# Patient Record
Sex: Female | Born: 1940 | Hispanic: No | Smoking: Former smoker
Health system: Southern US, Community
[De-identification: ages and names within clinical notes are randomized; demographics above are authoritative.]

## PROBLEM LIST (undated history)

## (undated) DIAGNOSIS — F419 Anxiety disorder, unspecified: Secondary | ICD-10-CM

## (undated) DIAGNOSIS — M109 Gout, unspecified: Secondary | ICD-10-CM

## (undated) DIAGNOSIS — M810 Age-related osteoporosis without current pathological fracture: Secondary | ICD-10-CM

## (undated) DIAGNOSIS — M1990 Unspecified osteoarthritis, unspecified site: Secondary | ICD-10-CM

## (undated) DIAGNOSIS — R011 Cardiac murmur, unspecified: Secondary | ICD-10-CM

## (undated) DIAGNOSIS — E079 Disorder of thyroid, unspecified: Secondary | ICD-10-CM

## (undated) DIAGNOSIS — K7689 Other specified diseases of liver: Secondary | ICD-10-CM

## (undated) DIAGNOSIS — E559 Vitamin D deficiency, unspecified: Secondary | ICD-10-CM

## (undated) DIAGNOSIS — R51 Headache: Secondary | ICD-10-CM

## (undated) DIAGNOSIS — N281 Cyst of kidney, acquired: Secondary | ICD-10-CM

## (undated) DIAGNOSIS — I10 Essential (primary) hypertension: Secondary | ICD-10-CM

## (undated) DIAGNOSIS — C801 Malignant (primary) neoplasm, unspecified: Secondary | ICD-10-CM

## (undated) DIAGNOSIS — F32A Depression, unspecified: Secondary | ICD-10-CM

## (undated) DIAGNOSIS — E785 Hyperlipidemia, unspecified: Secondary | ICD-10-CM

## (undated) DIAGNOSIS — N200 Calculus of kidney: Secondary | ICD-10-CM

## (undated) DIAGNOSIS — G4700 Insomnia, unspecified: Secondary | ICD-10-CM

## (undated) DIAGNOSIS — E039 Hypothyroidism, unspecified: Secondary | ICD-10-CM

## (undated) DIAGNOSIS — J45909 Unspecified asthma, uncomplicated: Secondary | ICD-10-CM

## (undated) DIAGNOSIS — J310 Chronic rhinitis: Secondary | ICD-10-CM

## (undated) DIAGNOSIS — M549 Dorsalgia, unspecified: Secondary | ICD-10-CM

## (undated) DIAGNOSIS — C4491 Basal cell carcinoma of skin, unspecified: Secondary | ICD-10-CM

## (undated) DIAGNOSIS — G8929 Other chronic pain: Secondary | ICD-10-CM

## (undated) DIAGNOSIS — F329 Major depressive disorder, single episode, unspecified: Secondary | ICD-10-CM

## (undated) HISTORY — PX: KNEE SURGERY: SHX244

## (undated) HISTORY — PX: STOMACH SURGERY: SHX791

## (undated) HISTORY — PX: CATARACT EXTRACTION: SUR2

## (undated) HISTORY — PX: TONSILLECTOMY: SUR1361

## (undated) HISTORY — PX: GLAUCOMA SURGERY: SHX656

## (undated) HISTORY — PX: ABDOMINAL HYSTERECTOMY: SHX81

## (undated) HISTORY — PX: TONSILLECTOMY: SHX5217

## (undated) SURGERY — Surgical Case
Anesthesia: *Unknown

---

## 2005-08-20 ENCOUNTER — Emergency Department (HOSPITAL_COMMUNITY): Admission: EM | Admit: 2005-08-20 | Discharge: 2005-08-20 | Payer: Self-pay | Admitting: Emergency Medicine

## 2005-12-04 ENCOUNTER — Emergency Department (HOSPITAL_COMMUNITY): Admission: EM | Admit: 2005-12-04 | Discharge: 2005-12-04 | Payer: Self-pay | Admitting: Emergency Medicine

## 2007-02-10 ENCOUNTER — Emergency Department (HOSPITAL_COMMUNITY): Admission: EM | Admit: 2007-02-10 | Discharge: 2007-02-10 | Payer: Self-pay | Admitting: Emergency Medicine

## 2008-02-03 ENCOUNTER — Other Ambulatory Visit: Admission: RE | Admit: 2008-02-03 | Discharge: 2008-02-03 | Payer: Self-pay | Admitting: Internal Medicine

## 2008-08-22 ENCOUNTER — Emergency Department (HOSPITAL_COMMUNITY): Admission: EM | Admit: 2008-08-22 | Discharge: 2008-08-22 | Payer: Self-pay | Admitting: Family Medicine

## 2008-12-19 ENCOUNTER — Encounter: Admission: RE | Admit: 2008-12-19 | Discharge: 2008-12-19 | Payer: Self-pay | Admitting: Internal Medicine

## 2009-04-03 ENCOUNTER — Encounter: Admission: RE | Admit: 2009-04-03 | Discharge: 2009-04-03 | Payer: Self-pay | Admitting: Internal Medicine

## 2009-04-18 ENCOUNTER — Ambulatory Visit (HOSPITAL_COMMUNITY): Admission: RE | Admit: 2009-04-18 | Discharge: 2009-04-18 | Payer: Self-pay | Admitting: Urology

## 2009-08-02 ENCOUNTER — Ambulatory Visit (HOSPITAL_COMMUNITY): Admission: RE | Admit: 2009-08-02 | Discharge: 2009-08-02 | Payer: Self-pay | Admitting: Internal Medicine

## 2009-12-20 ENCOUNTER — Encounter: Admission: RE | Admit: 2009-12-20 | Discharge: 2009-12-20 | Payer: Self-pay | Admitting: Internal Medicine

## 2011-01-20 ENCOUNTER — Other Ambulatory Visit: Payer: Self-pay | Admitting: Internal Medicine

## 2011-01-20 DIAGNOSIS — Z1231 Encounter for screening mammogram for malignant neoplasm of breast: Secondary | ICD-10-CM

## 2011-01-31 ENCOUNTER — Ambulatory Visit
Admission: RE | Admit: 2011-01-31 | Discharge: 2011-01-31 | Disposition: A | Discharge status: Home / Self Care | Payer: Medicare Other | Source: Ambulatory Visit | Attending: Internal Medicine | Admitting: Internal Medicine

## 2011-01-31 DIAGNOSIS — Z1231 Encounter for screening mammogram for malignant neoplasm of breast: Secondary | ICD-10-CM

## 2011-05-12 DIAGNOSIS — F411 Generalized anxiety disorder: Secondary | ICD-10-CM | POA: Diagnosis not present

## 2011-05-12 DIAGNOSIS — N1330 Unspecified hydronephrosis: Secondary | ICD-10-CM | POA: Diagnosis not present

## 2011-05-12 DIAGNOSIS — K219 Gastro-esophageal reflux disease without esophagitis: Secondary | ICD-10-CM | POA: Diagnosis not present

## 2011-05-15 DIAGNOSIS — N281 Cyst of kidney, acquired: Secondary | ICD-10-CM | POA: Diagnosis not present

## 2011-05-24 DIAGNOSIS — N281 Cyst of kidney, acquired: Secondary | ICD-10-CM | POA: Diagnosis not present

## 2011-06-04 DIAGNOSIS — N281 Cyst of kidney, acquired: Secondary | ICD-10-CM | POA: Diagnosis not present

## 2011-06-06 DIAGNOSIS — D510 Vitamin B12 deficiency anemia due to intrinsic factor deficiency: Secondary | ICD-10-CM | POA: Diagnosis not present

## 2011-06-06 DIAGNOSIS — K219 Gastro-esophageal reflux disease without esophagitis: Secondary | ICD-10-CM | POA: Diagnosis not present

## 2011-06-06 DIAGNOSIS — R109 Unspecified abdominal pain: Secondary | ICD-10-CM | POA: Diagnosis not present

## 2011-07-11 DIAGNOSIS — R12 Heartburn: Secondary | ICD-10-CM | POA: Diagnosis not present

## 2011-07-11 DIAGNOSIS — D126 Benign neoplasm of colon, unspecified: Secondary | ICD-10-CM | POA: Diagnosis not present

## 2011-07-11 DIAGNOSIS — D128 Benign neoplasm of rectum: Secondary | ICD-10-CM | POA: Diagnosis not present

## 2011-07-11 DIAGNOSIS — R1031 Right lower quadrant pain: Secondary | ICD-10-CM | POA: Diagnosis not present

## 2011-07-11 DIAGNOSIS — K5730 Diverticulosis of large intestine without perforation or abscess without bleeding: Secondary | ICD-10-CM | POA: Diagnosis not present

## 2011-07-11 DIAGNOSIS — D131 Benign neoplasm of stomach: Secondary | ICD-10-CM | POA: Diagnosis not present

## 2011-07-11 DIAGNOSIS — D449 Neoplasm of uncertain behavior of unspecified endocrine gland: Secondary | ICD-10-CM | POA: Diagnosis not present

## 2011-07-11 DIAGNOSIS — R1032 Left lower quadrant pain: Secondary | ICD-10-CM | POA: Diagnosis not present

## 2011-07-11 DIAGNOSIS — D129 Benign neoplasm of anus and anal canal: Secondary | ICD-10-CM | POA: Diagnosis not present

## 2011-07-11 DIAGNOSIS — K228 Other specified diseases of esophagus: Secondary | ICD-10-CM | POA: Diagnosis not present

## 2011-07-25 DIAGNOSIS — D510 Vitamin B12 deficiency anemia due to intrinsic factor deficiency: Secondary | ICD-10-CM | POA: Diagnosis not present

## 2011-07-25 DIAGNOSIS — D3A092 Benign carcinoid tumor of the stomach: Secondary | ICD-10-CM | POA: Diagnosis not present

## 2011-07-25 DIAGNOSIS — K862 Cyst of pancreas: Secondary | ICD-10-CM | POA: Diagnosis not present

## 2011-07-25 DIAGNOSIS — K863 Pseudocyst of pancreas: Secondary | ICD-10-CM | POA: Diagnosis not present

## 2011-07-28 DIAGNOSIS — D3A092 Benign carcinoid tumor of the stomach: Secondary | ICD-10-CM | POA: Diagnosis not present

## 2011-07-28 DIAGNOSIS — I10 Essential (primary) hypertension: Secondary | ICD-10-CM | POA: Diagnosis not present

## 2011-07-28 DIAGNOSIS — E782 Mixed hyperlipidemia: Secondary | ICD-10-CM | POA: Diagnosis not present

## 2011-07-28 DIAGNOSIS — J45909 Unspecified asthma, uncomplicated: Secondary | ICD-10-CM | POA: Diagnosis not present

## 2011-07-28 DIAGNOSIS — E039 Hypothyroidism, unspecified: Secondary | ICD-10-CM | POA: Diagnosis not present

## 2011-07-28 DIAGNOSIS — Z23 Encounter for immunization: Secondary | ICD-10-CM | POA: Diagnosis not present

## 2011-07-30 ENCOUNTER — Telehealth: Payer: Self-pay | Admitting: Hematology and Oncology

## 2011-07-30 NOTE — Telephone Encounter (Signed)
called pt lmovm to rtn call to schedule appt for 04/04

## 2011-07-31 ENCOUNTER — Telehealth: Payer: Self-pay | Admitting: Hematology and Oncology

## 2011-07-31 NOTE — Telephone Encounter (Signed)
called pt and she confirmed appt for 04/04.  faxed over a letter to Dr. Daphine Deutscher with appt d/t

## 2011-08-04 ENCOUNTER — Telehealth: Payer: Self-pay | Admitting: Hematology and Oncology

## 2011-08-04 NOTE — Telephone Encounter (Signed)
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

## 2011-08-07 ENCOUNTER — Ambulatory Visit (HOSPITAL_BASED_OUTPATIENT_CLINIC_OR_DEPARTMENT_OTHER): Payer: Medicare Other | Admitting: Hematology and Oncology

## 2011-08-07 ENCOUNTER — Ambulatory Visit: Payer: Medicare Other

## 2011-08-07 ENCOUNTER — Encounter: Payer: Self-pay | Admitting: Hematology and Oncology

## 2011-08-07 ENCOUNTER — Ambulatory Visit (HOSPITAL_BASED_OUTPATIENT_CLINIC_OR_DEPARTMENT_OTHER): Payer: Medicare Other | Admitting: Lab

## 2011-08-07 ENCOUNTER — Telehealth: Payer: Self-pay | Admitting: Hematology and Oncology

## 2011-08-07 VITALS — BP 146/89 | HR 84 | Temp 97.4°F | Ht 63.0 in | Wt 177.6 lb

## 2011-08-07 DIAGNOSIS — C7A092 Malignant carcinoid tumor of the stomach: Secondary | ICD-10-CM

## 2011-08-07 DIAGNOSIS — D510 Vitamin B12 deficiency anemia due to intrinsic factor deficiency: Secondary | ICD-10-CM | POA: Insufficient documentation

## 2011-08-07 DIAGNOSIS — D3A092 Benign carcinoid tumor of the stomach: Secondary | ICD-10-CM | POA: Insufficient documentation

## 2011-08-07 DIAGNOSIS — M109 Gout, unspecified: Secondary | ICD-10-CM | POA: Insufficient documentation

## 2011-08-07 DIAGNOSIS — M1990 Unspecified osteoarthritis, unspecified site: Secondary | ICD-10-CM | POA: Insufficient documentation

## 2011-08-07 DIAGNOSIS — I10 Essential (primary) hypertension: Secondary | ICD-10-CM | POA: Insufficient documentation

## 2011-08-07 DIAGNOSIS — J45909 Unspecified asthma, uncomplicated: Secondary | ICD-10-CM | POA: Insufficient documentation

## 2011-08-07 DIAGNOSIS — H409 Unspecified glaucoma: Secondary | ICD-10-CM | POA: Insufficient documentation

## 2011-08-07 DIAGNOSIS — Z9889 Other specified postprocedural states: Secondary | ICD-10-CM | POA: Insufficient documentation

## 2011-08-07 DIAGNOSIS — E039 Hypothyroidism, unspecified: Secondary | ICD-10-CM | POA: Insufficient documentation

## 2011-08-07 DIAGNOSIS — E785 Hyperlipidemia, unspecified: Secondary | ICD-10-CM | POA: Insufficient documentation

## 2011-08-07 DIAGNOSIS — C7A00 Malignant carcinoid tumor of unspecified site: Secondary | ICD-10-CM

## 2011-08-07 DIAGNOSIS — Z90710 Acquired absence of both cervix and uterus: Secondary | ICD-10-CM | POA: Insufficient documentation

## 2011-08-07 DIAGNOSIS — N83209 Unspecified ovarian cyst, unspecified side: Secondary | ICD-10-CM | POA: Diagnosis not present

## 2011-08-07 LAB — CBC WITH DIFFERENTIAL/PLATELET
BASO%: 0.9 % (ref 0.0–2.0)
Eosinophils Absolute: 0.2 10*3/uL (ref 0.0–0.5)
MCH: 26.8 pg (ref 25.1–34.0)
MONO%: 7 % (ref 0.0–14.0)
Platelets: 210 10*3/uL (ref 145–400)
RBC: 4.62 10*6/uL (ref 3.70–5.45)
RDW: 16 % — ABNORMAL HIGH (ref 11.2–14.5)
WBC: 6.3 10*3/uL (ref 3.9–10.3)
nRBC: 0 % (ref 0–0)

## 2011-08-07 NOTE — Progress Notes (Signed)
Dr.    Burney Gauze     -     Primary. Dr.    Josefa Half   -      GI. Dr.    Marcha Solders      -    Urologist  In  [facility].  [employer]   [facility]  On   [address] /  [address]  Cell    Phone       [phone].

## 2011-08-07 NOTE — Progress Notes (Signed)
This office note has been dictated.

## 2011-08-07 NOTE — Progress Notes (Signed)
CC:   Kellie Housekeeper, MD Kellie Ward, [name] Kellie Dubin, MD Kellie Mouse, MD, Fax [phone] Kellie Ward. Kellie Ward, [name] Kellie Hitch, MD  REFERRING PHYSICIAN:  [name], [name]  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  HISTORY OF PRESENT ILLNESS:  The patient states that in general her history dates back a few years, with a history of intermittent right upper quadrant discomfort.  She was initially diagnosed with a kidney infection and was referred to [facility] as a previous ultrasound had documented renal cysts.  There she received an MRI of the abdomen on May 24, 2011.  The lower chest showed a normal heart size, no pericardial effusion.  The lungs showed no masses, consolidations or effusions.  The liver revealed several tiny T2 hyperintense foci felt to represent hepatic cysts or biliary hematomas. The pancreas showed a 9 mm cystic lesion in the head without pancreatic ductal dilatation.  There was evidence of colonic diverticulosis without diverticulitis.  There was no celiac or mesenteric adenopathy.  There was a heterogeneous lesion involving the T12 vertebral body with an additional focus involving the right pedicle at T12.  Cystic structures were also seen within the renal collecting systems bilaterally compatible with renal sinus cysts.  The patient was subsequently referred to Dr. [name] for dyspepsia-type symptoms.  He performed an endoscopy on July 09, 2011, which showed multiple 3-7 mm sessile polyps with no bleeding and no stigmata of recent bleeding.  The Z-line was irregular, and biopsies were taken with cold forceps for histology.  The duodenum was normal.  A colonoscopy had shown extensive diverticulitis but otherwise no masses noted. 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   A referral was placed to Dr. Georges Ward at [facility] for an endoscopic mucosal resection.  The patient was sent to Oncology for additional recommendations.  The past medical history is also significant for asthma.  She also reports on average 3-4 loose stools daily.  Denies night sweats.  Denies weight loss.    PAST MEDICAL HISTORY: 1. Pernicious anemia. 2. Hypertension. 3. Dyslipidemia. 4. Hypothyroidism. 5. Asthma. 6. Anxiety. 7. Glaucoma. 8. Osteoarthritis of the knee. 9. Depression. 10.Gout. 11.Partial abdominal hysterectomy. 12.Bilateral knee surgeries. 13.Poor arterial circulation.  ALLERGIES:  Aspirin, morphine sulfate, iodine contrast dye, nonsteroidals, Ben-Gay, honey.  MEDICATIONS:  Plavix 75 mg every other day, Ambien 10 mg q.h.s., multivitamin 1 tablet daily, Synthroid 88 mcg daily, vitamin D 50,000 units weekly, Zocor 20 mg daily, Robaxin 500 mg q.i.d. p.r.n., vitamin B12 one thousand milligrams every 3 weeks, Ventolin 1 puff q.6 p.r.n., Azopt 1% to left eye b.i.d., Advair Diskus 1 b.i.d. p.r.n., diltiazem 240 mg daily.  SOCIAL HISTORY:  The patient is married with 5 children.  She is a former smoker, smoking 1-1/2 packs per day for 20 years and gave up in 1990.  She denies alcohol use.  She is a retired Engineer, site.  FAMILY HISTORY:  Negative for oncologic malignancies.  REVIEW OF SYSTEMS:  Denies fever, chills, night sweats, anorexia, weight loss.  GI:  Denies nausea, vomiting.  Notes intermittent right upper quadrant pain.  Notes loose stools.  Denies rectal bleeding. Cardiovascular:  Denies chest pain, PND, orthopnea, ankle swelling. Respirations:  Denies cough, hemoptysis, wheeze, shortness of breath. Skin:  Has a history of morphea but no bruising, petechiae. Musculoskeletal:  Denies joint aches, muscle pains.  Neurologic:  Denies headaches, vision change or  extremity weakness.  PHYSICAL EXAMINATION:  The patient is alert and oriented x3.  Vital signs:  Pulse 84, blood pressure 146/89, temperature 97.4, respirations 20, weight 177 pounds.  HEENT:  Head is atraumatic, normocephalic. Sclerae are anicteric.  Mouth moist.  Neck:  Supple.  Chest:  Clear to percussion with few scattered wheeze.  Abdomen:  Soft, nontender.  No palpable masses.  Bowel sounds present.  Extremities:  No edema.  Pulses present and symmetrical.  Lymph nodes:  No adenopathy.  CNS:  Nonfocal.  IMPRESSION AND PLAN:  Kellie Ward is a 71 year old woman recently diagnosed with a gastric carcinoid.  In addition, she has been referred to see Dr. Georges Ward at [facility] for endoscopic mucosal resection; but before doing so, she needs to undergo a staging workup to make sure that she does not have any other evidence of disease.  I recommend she receive a CT scan of the chest, abdomen, and pelvis.  We have informed Radiology that she is allergic to IV contrast.  I also recommend an octreotide scan and also a 24-hour urine for HIAA.  We will also obtain chromogranin levels and CBC and comprehensive metabolic panel.  We spent more than half the time discussing indications for diagnosis and localized versus metastatic disease.  We also discussed pathology and potential treatments.  She had a number of questions, which were all answered.  She follows up to discuss results.    ______________________________ Kellie Ward, [name] LIO/MEDQ  D:  08/07/2011  T:  08/07/2011  Job:  [id number]

## 2011-08-07 NOTE — Patient Instructions (Signed)
Patient to follow up as instructed.   No current outpatient prescriptions on file.        April 2013  Sunday Monday Tuesday Wednesday Thursday Friday Saturday      1   2   3   4    FINANCIAL COUNSELING   9:30 AM  (30 min.)  [facility]  [facility]   NEW PATIENT 60  10:00 AM  (60 min.)  Laurice Record, MD  [facility] 5   6    7   8   9   10   11   12   13    14   15   16   17   18   19   20    21   22   23   24   25   26   27    28   29    30

## 2011-08-07 NOTE — Telephone Encounter (Signed)
Gv pt appt for [id number].  scheduled ct scan for 04/09 @ [facility]. scheduled octritide for 04/16 @ [facility]

## 2011-08-11 DIAGNOSIS — D3A092 Benign carcinoid tumor of the stomach: Secondary | ICD-10-CM | POA: Diagnosis not present

## 2011-08-11 LAB — COMPREHENSIVE METABOLIC PANEL
Alkaline Phosphatase: 93 U/L (ref 39–117)
CO2: 28 mEq/L (ref 19–32)
Chloride: 105 mEq/L (ref 96–112)
Glucose, Bld: 108 mg/dL — ABNORMAL HIGH (ref 70–99)
Potassium: 4.2 mEq/L (ref 3.5–5.3)
Sodium: 141 mEq/L (ref 135–145)
Total Protein: 6.5 g/dL (ref 6.0–8.3)

## 2011-08-11 LAB — CHROMOGRANIN A: Chromogranin A: 50 ng/mL — ABNORMAL HIGH (ref 1.9–15.0)

## 2011-08-12 ENCOUNTER — Other Ambulatory Visit: Payer: Self-pay | Admitting: Hematology and Oncology

## 2011-08-12 ENCOUNTER — Other Ambulatory Visit: Payer: Self-pay | Admitting: *Deleted

## 2011-08-12 ENCOUNTER — Ambulatory Visit (HOSPITAL_COMMUNITY)
Admission: RE | Admit: 2011-08-12 | Discharge: 2011-08-12 | Disposition: A | Discharge status: Home / Self Care | Payer: Medicare Other | Source: Ambulatory Visit | Attending: Hematology and Oncology | Admitting: Hematology and Oncology

## 2011-08-12 DIAGNOSIS — N281 Cyst of kidney, acquired: Secondary | ICD-10-CM | POA: Diagnosis not present

## 2011-08-12 DIAGNOSIS — C7A00 Malignant carcinoid tumor of unspecified site: Secondary | ICD-10-CM

## 2011-08-12 DIAGNOSIS — Z90710 Acquired absence of both cervix and uterus: Secondary | ICD-10-CM | POA: Diagnosis not present

## 2011-08-12 DIAGNOSIS — D3A092 Benign carcinoid tumor of the stomach: Secondary | ICD-10-CM | POA: Insufficient documentation

## 2011-08-12 DIAGNOSIS — N8111 Cystocele, midline: Secondary | ICD-10-CM | POA: Insufficient documentation

## 2011-08-12 DIAGNOSIS — Z859 Personal history of malignant neoplasm, unspecified: Secondary | ICD-10-CM | POA: Diagnosis not present

## 2011-08-12 MED ORDER — IOHEXOL 300 MG/ML  SOLN
80.0000 mL | Freq: Once | INTRAMUSCULAR | Status: AC | PRN
  Administered 2011-08-12: 80 mL via INTRAVENOUS

## 2011-08-13 ENCOUNTER — Telehealth: Payer: Self-pay | Admitting: Hematology and Oncology

## 2011-08-13 NOTE — Telephone Encounter (Signed)
caled pts home lmovm for that appt on 05/03 was moved to 04/16 @ 2pm and to rtn call to confirm appt changes

## 2011-08-14 ENCOUNTER — Telehealth: Payer: Self-pay | Admitting: Hematology and Oncology

## 2011-08-14 NOTE — Telephone Encounter (Signed)
pt rtn call to confirm appt on 04/16

## 2011-08-19 ENCOUNTER — Ambulatory Visit (HOSPITAL_BASED_OUTPATIENT_CLINIC_OR_DEPARTMENT_OTHER): Payer: Medicare Other | Admitting: Hematology and Oncology

## 2011-08-19 ENCOUNTER — Encounter (HOSPITAL_COMMUNITY)
Admission: RE | Admit: 2011-08-19 | Discharge: 2011-08-19 | Disposition: A | Discharge status: Home / Self Care | Payer: Medicare Other | Source: Ambulatory Visit | Attending: Hematology and Oncology | Admitting: Hematology and Oncology

## 2011-08-19 ENCOUNTER — Encounter: Payer: Self-pay | Admitting: Hematology and Oncology

## 2011-08-19 VITALS — BP 149/90 | HR 72 | Temp 97.1°F | Ht 63.0 in | Wt 177.8 lb

## 2011-08-19 DIAGNOSIS — C7A00 Malignant carcinoid tumor of unspecified site: Secondary | ICD-10-CM

## 2011-08-19 DIAGNOSIS — D3A092 Benign carcinoid tumor of the stomach: Secondary | ICD-10-CM | POA: Insufficient documentation

## 2011-08-19 DIAGNOSIS — D3A00 Benign carcinoid tumor of unspecified site: Secondary | ICD-10-CM | POA: Diagnosis not present

## 2011-08-19 NOTE — Patient Instructions (Signed)
Patient to follow up as instructed.   Current Outpatient Prescriptions  Medication Sig Dispense Refill  . albuterol (PROVENTIL HFA;VENTOLIN HFA) 108 (90 BASE) MCG/ACT inhaler Inhale 1 puff into the lungs every 6 (six) hours as needed.      . brinzolamide (AZOPT) 1 % ophthalmic suspension Place 1 drop into the left eye 2 (two) times daily.      . clopidogrel (PLAVIX) 75 MG tablet Take 75 mg by mouth every other day.      . Cyanocobalamin (VITAMIN B-12 IJ) Inject 1,000 mcg as directed every 21 ( twenty-one) days.      Marland Kitchen diltiazem (TIAZAC) 240 MG 24 hr capsule Take 240 mg by mouth daily.      . Fluticasone-Salmeterol (ADVAIR) 250-50 MCG/DOSE AEPB Inhale 1 puff into the lungs 2 (two) times daily as needed.      . irbesartan-hydrochlorothiazide (AVALIDE) 150-12.5 MG per tablet Take 1 tablet by mouth daily.      Marland Kitchen levothyroxine (SYNTHROID, LEVOTHROID) 88 MCG tablet Take 88 mcg by mouth daily.      . methocarbamol (ROBAXIN) 500 MG tablet Take 500 mg by mouth 4 (four) times daily as needed.      . Multiple Vitamin (MULTIVITAMIN) tablet Take 1 tablet by mouth daily.      . simvastatin (ZOCOR) 20 MG tablet Take 20 mg by mouth daily.      . Vitamin D, Ergocalciferol, (DRISDOL) 50000 UNITS CAPS Take 50,000 Units by mouth every 7 (seven) days.      Marland Kitchen zolpidem (AMBIEN) 10 MG tablet Take 10 mg by mouth at bedtime.            April 2013  Sunday Monday Tuesday Wednesday Thursday Friday Saturday      1   2   3   4    FINANCIAL COUNSELING   9:30 AM  (30 min.)  [facility]  [facility]   NEW PATIENT 60  10:00 AM  (60 min.)  Laurice Record, MD  [facility]   LAB ADDON  11:30 AM  (15 min.)  Krista Blue  [facility] 5   6    7   8   9    CT BODY W/  12:30 PM  (30 min.)  Wl-Ct 2  [facility]-CT IMAGING 10   11   12   13    14   15   16    NM INJECTION   1:00 PM  (15 min.)  Wl-Nm Inj 1  [facility]-NUCLEAR MEDICINE   EST PT 30   2:00 PM  (30 min.)  Ellagrace Yoshida I Mete Purdum, MD  [facility] ONCOLOG[facility] 17    NMX TUMOR LOC W/SPECT  11:00 AM  (60 min.)  Wl-Nm 1  [facility]-NUCLEAR [facility] 18    NMX TUMOR LOC W/SPECT  11:00 AM  (145 min.)  Wl-Nm 1  [facility]-NUCLEAR [facility]IC[facility] 19   20    21   22   23   24   25   26   27    28   29    30

## 2011-08-19 NOTE — Progress Notes (Signed)
This office note has been dictated.

## 2011-08-20 ENCOUNTER — Encounter (HOSPITAL_COMMUNITY)
Admission: RE | Admit: 2011-08-20 | Discharge: 2011-08-20 | Disposition: A | Discharge status: Home / Self Care | Payer: Medicare Other | Source: Ambulatory Visit | Attending: Hematology and Oncology | Admitting: Hematology and Oncology

## 2011-08-20 DIAGNOSIS — D3A092 Benign carcinoid tumor of the stomach: Secondary | ICD-10-CM | POA: Diagnosis not present

## 2011-08-20 MED ORDER — INDIUM IN-111 PENTETREOTIDE IV KIT
6.4000 | PACK | Freq: Once | INTRAVENOUS | Status: AC | PRN
  Administered 2011-08-20: 6.4 via INTRAVENOUS

## 2011-08-20 NOTE — Progress Notes (Signed)
CC:   Georgann Housekeeper, MD Danise Edge, [name] Shelby Dubin, MD Georges Mouse, MD, Fax [phone]  IDENTIFYING STATEMENT:  The patient is a 71 year old woman who presents for followup.  INTERVAL HISTORY:  In summary, the patient referred with a gastric low- grade carcinoid.  She is asymptomatic in general but she completed workup with Oncology and results are as follows:  A CT scan of the chest, abdomen, pelvis on 08/12/2011 showed no evidence of malignancy. There was small stable hepatic cysts.  The spleen was of normal size. The pancreas was normal.  There are numerous small renal cysts and bilateral stable pelvic cysts.  The stomach, duodenum, small bowel and colon were unremarkable.  A 24-hour urine for 5-hydroxyindoleacetic acid was slightly elevated at 6.5.  Chromogranin A was elevated at 50.  She is to receive an octreotide scan in the morning.  MEDICATIONS:  Reviewed and updated.  ALLERGIES:  None.  Past medical history, family history and social history unchanged.  REVIEW OF SYSTEMS:  10-point review of systems negative.  PHYSICAL EXAM:  General:  Patient is alert and oriented x3.  Vitals: Pulse 72, blood pressure 149/98, temperature 97.1, respirations 20, weight 177.8 pounds.  HEENT:  Head is atraumatic, normocephalic. Sclerae anicteric.  Mouth moist.  Neck:  Supple.  Chest:  Clear.  CVS: Unremarkable.  Abdomen:  Soft, nontender.  Bowel sounds present. Extremities:  No edema.  LAB DATA:  As above.  In addition, 08/07/2011 white cell count 6.3, hemoglobin 12.4, hematocrit 38.3, platelets 210.  Sodium 141, potassium 4.2, chloride 105, CO2 28, BUN 18, creatinine 0.87, glucose 108, total bilirubin 0.3, alkaline phosphatase 93, AST 11, ALT 14.  IMPRESSION AND PLAN:  Kellie Ward is a 71 year old woman with a low- grade gastric carcinoid.  CTs indicate local disease but octreotide scan is pending.  We will telephone her with those results.  She sees Dr. Laurence Compton on 09/01/2011  for an endoscopic exam with possible local resection.  She returns for followup.    ______________________________ Laurice Record, [name] LIO/MEDQ  D:  08/19/2011  T:  08/19/2011  Job:  [id number]

## 2011-08-21 ENCOUNTER — Encounter (HOSPITAL_COMMUNITY): Payer: Medicare Other

## 2011-08-22 ENCOUNTER — Telehealth: Payer: Self-pay | Admitting: *Deleted

## 2011-08-22 NOTE — Telephone Encounter (Signed)
Spoke with pt and informed pt re:  Octreotide scan results  Negative as per md.   Instructed pt to pick up scan disc from radiology to take to [facility] for her appt on 09/01/11.   Pt voiced understanding.

## 2011-09-01 DIAGNOSIS — C169 Malignant neoplasm of stomach, unspecified: Secondary | ICD-10-CM | POA: Diagnosis not present

## 2011-09-01 DIAGNOSIS — K2940 Chronic atrophic gastritis without bleeding: Secondary | ICD-10-CM | POA: Diagnosis not present

## 2011-09-01 DIAGNOSIS — D131 Benign neoplasm of stomach: Secondary | ICD-10-CM | POA: Diagnosis not present

## 2011-09-01 DIAGNOSIS — C7A092 Malignant carcinoid tumor of the stomach: Secondary | ICD-10-CM | POA: Diagnosis not present

## 2011-09-01 DIAGNOSIS — R599 Enlarged lymph nodes, unspecified: Secondary | ICD-10-CM | POA: Diagnosis not present

## 2011-09-05 ENCOUNTER — Ambulatory Visit: Payer: Medicare Other | Admitting: Hematology and Oncology

## 2011-09-09 DIAGNOSIS — J45909 Unspecified asthma, uncomplicated: Secondary | ICD-10-CM | POA: Diagnosis not present

## 2011-09-09 DIAGNOSIS — J209 Acute bronchitis, unspecified: Secondary | ICD-10-CM | POA: Diagnosis not present

## 2011-09-19 DIAGNOSIS — Z483 Aftercare following surgery for neoplasm: Secondary | ICD-10-CM | POA: Diagnosis not present

## 2011-09-19 DIAGNOSIS — K259 Gastric ulcer, unspecified as acute or chronic, without hemorrhage or perforation: Secondary | ICD-10-CM | POA: Diagnosis not present

## 2011-09-19 DIAGNOSIS — K2940 Chronic atrophic gastritis without bleeding: Secondary | ICD-10-CM | POA: Diagnosis not present

## 2011-09-19 DIAGNOSIS — D3A092 Benign carcinoid tumor of the stomach: Secondary | ICD-10-CM | POA: Diagnosis not present

## 2011-09-19 DIAGNOSIS — Z8719 Personal history of other diseases of the digestive system: Secondary | ICD-10-CM | POA: Diagnosis not present

## 2011-09-19 DIAGNOSIS — K449 Diaphragmatic hernia without obstruction or gangrene: Secondary | ICD-10-CM | POA: Diagnosis not present

## 2011-09-19 DIAGNOSIS — D131 Benign neoplasm of stomach: Secondary | ICD-10-CM | POA: Diagnosis not present

## 2011-09-29 ENCOUNTER — Emergency Department (HOSPITAL_COMMUNITY)
Admission: EM | Admit: 2011-09-29 | Discharge: 2011-09-30 | Disposition: A | Discharge status: Home / Self Care | Payer: Medicare Other | Attending: Emergency Medicine | Admitting: Emergency Medicine

## 2011-09-29 ENCOUNTER — Encounter (HOSPITAL_COMMUNITY): Payer: Self-pay | Admitting: *Deleted

## 2011-09-29 DIAGNOSIS — E079 Disorder of thyroid, unspecified: Secondary | ICD-10-CM | POA: Diagnosis not present

## 2011-09-29 DIAGNOSIS — I10 Essential (primary) hypertension: Secondary | ICD-10-CM | POA: Insufficient documentation

## 2011-09-29 DIAGNOSIS — R197 Diarrhea, unspecified: Secondary | ICD-10-CM | POA: Insufficient documentation

## 2011-09-29 DIAGNOSIS — K5289 Other specified noninfective gastroenteritis and colitis: Secondary | ICD-10-CM | POA: Insufficient documentation

## 2011-09-29 DIAGNOSIS — R112 Nausea with vomiting, unspecified: Secondary | ICD-10-CM | POA: Diagnosis not present

## 2011-09-29 DIAGNOSIS — R011 Cardiac murmur, unspecified: Secondary | ICD-10-CM | POA: Diagnosis not present

## 2011-09-29 DIAGNOSIS — R188 Other ascites: Secondary | ICD-10-CM | POA: Diagnosis not present

## 2011-09-29 DIAGNOSIS — R1110 Vomiting, unspecified: Secondary | ICD-10-CM | POA: Diagnosis not present

## 2011-09-29 DIAGNOSIS — Z79899 Other long term (current) drug therapy: Secondary | ICD-10-CM | POA: Diagnosis not present

## 2011-09-29 DIAGNOSIS — R109 Unspecified abdominal pain: Secondary | ICD-10-CM | POA: Diagnosis not present

## 2011-09-29 DIAGNOSIS — R1011 Right upper quadrant pain: Secondary | ICD-10-CM | POA: Insufficient documentation

## 2011-09-29 DIAGNOSIS — K529 Noninfective gastroenteritis and colitis, unspecified: Secondary | ICD-10-CM

## 2011-09-29 HISTORY — DX: Essential (primary) hypertension: I10

## 2011-09-29 HISTORY — DX: Disorder of thyroid, unspecified: E07.9

## 2011-09-29 MED ORDER — SODIUM CHLORIDE 0.9 % IV BOLUS (SEPSIS)
1000.0000 mL | INTRAVENOUS | Status: AC
  Administered 2011-09-29: 1000 mL via INTRAVENOUS

## 2011-09-29 MED ORDER — KETOROLAC TROMETHAMINE 30 MG/ML IJ SOLN
30.0000 mg | Freq: Once | INTRAMUSCULAR | Status: AC
  Administered 2011-09-29: 30 mg via INTRAVENOUS
  Filled 2011-09-29: qty 1

## 2011-09-29 MED ORDER — ONDANSETRON HCL 4 MG/2ML IJ SOLN
4.0000 mg | Freq: Once | INTRAMUSCULAR | Status: AC
  Administered 2011-09-29: 4 mg via INTRAVENOUS
  Filled 2011-09-29: qty 2

## 2011-09-29 NOTE — ED Notes (Signed)
Pt reports pain is similar to when she was diagnosed with kidney stones.

## 2011-09-29 NOTE — ED Provider Notes (Addendum)
History     CSN: [id number]  Arrival date & time 09/29/11  2202   First MD Initiated Contact with Patient 09/29/11 2314      Chief Complaint  Patient presents with  . Abdominal Pain    (Consider location/radiation/quality/duration/timing/severity/associated sxs/prior treatment) HPI Comments: 71 year old female with a recent history of a new diagnosis of a gastric carcinoid tumor. She presents because of acute onset of right upper quadrant pain followed by multiple episodes of vomiting and watery diarrhea over the course of the last 18 hours. The symptoms are persistent, severe, nothing makes better or worse. She denies back pain chest pain shortness of breath fevers chills swelling rashes dysuria. She states that she has had these symptoms in the past and was worked up in December of last year at an outside hospital for similar symptoms. Her carcinoid was found on endoscopy in March and she has since been referred to the oncology clinic here with Dr. Dalene Carrow and sees a gastroenterologist Dr. Danise Edge.  Patient is a 71 y.o. female presenting with abdominal pain. The history is provided by the patient, a relative and medical records.  Abdominal Pain The primary symptoms of the illness include abdominal pain.    Past Medical History  Diagnosis Date  . Hypertension   . Thyroid disease     Past Surgical History  Procedure Date  . Abdominal hysterectomy   . Glaucoma surgery   . Cataract extraction     No family history on file.  History  Substance Use Topics  . Smoking status: Former Smoker -- 1.5 packs/day for 22 years    Types: Cigarettes  . Smokeless tobacco: Not on file  . Alcohol Use: No    OB History    Grav Para Term Preterm Abortions TAB SAB Ect Mult Living                  Review of Systems  Gastrointestinal: Positive for abdominal pain.  All other systems reviewed and are negative.    Allergies  Aspirin; Contrast media; Honey; Menthol (topical  analgesic); Morphine and related; and Iohexol  Home Medications   Current Outpatient Rx  Name Route Sig Dispense Refill  . ALBUTEROL SULFATE HFA 108 (90 BASE) MCG/ACT IN AERS Inhalation Inhale 1 puff into the lungs every 6 (six) hours as needed. For shortness of breath    . BRINZOLAMIDE 1 % OP SUSP Left Eye Place 1 drop into the left eye 2 (two) times daily.    Marland Kitchen CLOPIDOGREL BISULFATE 75 MG PO TABS Oral Take 75 mg by mouth every other day.    Marland Kitchen VITAMIN B-12 IJ Injection Inject 1,000 mcg as directed every 21 ( twenty-one) days.    Marland Kitchen DILTIAZEM HCL ER BEADS 240 MG PO CP24 Oral Take 240 mg by mouth daily.    Marland Kitchen FLUTICASONE-SALMETEROL 250-50 MCG/DOSE IN AEPB Inhalation Inhale 1 puff into the lungs 2 (two) times daily.     Marland Kitchen LEVOTHYROXINE SODIUM 88 MCG PO TABS Oral Take 88 mcg by mouth daily.    Marland Kitchen ONE-DAILY MULTI VITAMINS PO TABS Oral Take 1 tablet by mouth daily.    Marland Kitchen VITAMIN D (ERGOCALCIFEROL) 50000 UNITS PO CAPS Oral Take 50,000 Units by mouth every 7 (seven) days.    Marland Kitchen HYDROMORPHONE HCL 2 MG PO TABS Oral Take 0.5 tablets (1 mg total) by mouth every 6 (six) hours as needed for pain. 15 tablet 0  . LOPERAMIDE HCL 2 MG PO CAPS Oral Take 1 capsule (2  mg total) by mouth 4 (four) times daily as needed for diarrhea or loose stools. 12 capsule 0  . ONDANSETRON 4 MG PO TBDP Oral Take 1 tablet (4 mg total) by mouth every 8 (eight) hours as needed for nausea. 10 tablet 0  . PROMETHAZINE HCL 25 MG PO TABS Oral Take 1 tablet (25 mg total) by mouth every 6 (six) hours as needed for nausea. 12 tablet 0    BP 122/63  Pulse 77  Temp(Src) 97.8 F (36.6 C) (Oral)  Resp 14  SpO2 97%  Physical Exam  Nursing note and vitals reviewed. Constitutional: She appears well-developed and well-nourished.       Uncomfortable appearing  HENT:  Head: Normocephalic and atraumatic.  Mouth/Throat: No oropharyngeal exudate.       Mucous membranes mildly dehydrated  Eyes: Conjunctivae and EOM are normal. Pupils are equal,  round, and reactive to light. Right eye exhibits no discharge. Left eye exhibits no discharge. No scleral icterus.  Neck: Normal range of motion. Neck supple. No JVD present. No thyromegaly present.  Cardiovascular: Normal rate, regular rhythm and intact distal pulses.  Exam reveals no gallop and no friction rub.   Murmur ( Soft systolic murmur) heard.      Peripheral pulses at the radial arteries  Pulmonary/Chest: Effort normal and breath sounds normal. No respiratory distress. She has no wheezes. She has no rales.  Abdominal: Soft. Bowel sounds are normal. She exhibits no distension and no mass. There is tenderness ( Right upper quadrant tenderness with guarding, soft and non-peritoneal, mild pain in the left upper quadrant, left lower cautery and and midabdomen.).       No pain at McBurney's point, normal bowel sounds  Musculoskeletal: Normal range of motion. She exhibits no edema and no tenderness.  Lymphadenopathy:    She has no cervical adenopathy.  Neurological: She is alert. Coordination normal.  Skin: Skin is warm and dry. No rash noted. No erythema.  Psychiatric: She has a normal mood and affect. Her behavior is normal.    ED Course  Procedures (including critical care time)  Labs Reviewed  CBC - Abnormal; Notable for the following:    RBC 5.25 (*)    RDW 16.0 (*)    All other components within normal limits  DIFFERENTIAL - Abnormal; Notable for the following:    Neutrophils Relative 84 (*)    Lymphocytes Relative 10 (*)    All other components within normal limits  URINALYSIS, ROUTINE W REFLEX MICROSCOPIC - Abnormal; Notable for the following:    Ketones, ur 15 (*)    Leukocytes, UA SMALL (*)    All other components within normal limits  POCT I-STAT, CHEM 8 - Abnormal; Notable for the following:    Glucose, Bld 128 (*)    Hemoglobin 15.6 (*)    All other components within normal limits  LIPASE, BLOOD  LACTIC ACID, PLASMA  APTT  PROTIME-INR  TYPE AND SCREEN  HEPATIC  FUNCTION PANEL  URINE MICROSCOPIC-ADD ON  ABO/RH   Ct Abdomen Pelvis Wo Contrast  09/30/2011  *RADIOLOGY REPORT*  Clinical Data: Right-sided abdominal pain.  Nausea, vomiting, and diarrhea.  Recent diagnosis of stomach carcinoma.  CT ABDOMEN AND PELVIS WITHOUT CONTRAST  Technique:  Multidetector CT imaging of the abdomen and pelvis was performed following the standard protocol without intravenous contrast.  Comparison: CT abdomen and pelvis 08/12/2011.  Multiple previous CT scans of the abdomen and pelvis.  Findings: No IV contrast material given due to history of  allergy. Atelectasis in the right lung base.  Stomach is distended with contrast material.  Contrast material flows into the small bowel and colon without evidence of obstruction.  Mild wall thickening in the distal gastric wall which might correlate with the patient's history of cancer.  The unenhanced appearance of the liver, spleen, gallbladder, pancreas, adrenal glands, and retroperitoneal lymph nodes is unremarkable. Mild calcification of the abdominal aorta without aneurysm.  Both kidneys demonstrate hydronephrosis versus parapelvic cysts.  This pattern has been present on multiple studies dating back to 12/04/2005 on the right hand 04/03/2009 bilaterally.  The ureters are decompressed and no obstructing stones or masses are visualized.  Small bowel are decompressed.  Prominent visceral adipose tissues.  No free air or free fluid in the abdomen.  Pelvis:  The uterus appears to be surgically absent.  Adnexal structures are not enlarged.  No free or loculated pelvic fluid collections.  No bladder wall thickening.  No significant pelvic lymphadenopathy. The appendix is not visualized and may be surgically absent.  Scattered diverticula in the colon without inflammatory change.  No colonic distension.  Normal alignment of the lumbar vertebrae.  IMPRESSION: No significant change since previous study.  No acute inflammatory process is demonstrated.   There is thickening of the distal stomach wall which is nonspecific but could correlate with history of gastric carcinoma.  Chronic bilateral renal sinus fluid collections which could represent parapelvic cysts or hydronephrosis.  No evidence of bowel obstruction.  Original Report Authenticated By: Marlon Pel, [name]     1. Gastroenteritis       MDM  Patient has what appears to be an acute gastroenteritis but has significant right upper quadrant pain which would raise suspicion for cholecystitis, pancreatitis is a possibility. Her last biopsy was May 7 and she has had no bleeding or complications from this procedure since that time.   Care was discussed with the patient, I have reviewed all of her results with her, there does not appear to be a leukocytosis or significant abnormalities in her electrolytes or liver function tests or lipase. She has a normal lactic acid, and urinalysis reveals 15 ketones but no signs of infection. We'll proceed with CT scan due to tenderness, discussed with her the indications for increased pain control which she wants. She expresses that she had a dose of morphine in the past which caused some difficulty breathing but wants to try a different medication and has agreed to try hydromorphone. We will give 0.5 mg reassessed.  1:10 AM, I as to decide the patient spent the last 10 minutes after getting hydromorphone she has had no adverse outcomes. At this time she has mild improvement in her pain, vital signs are totally normal, IV fluids are being given.   4:30 - pt improved, results explained - she feels comfortable going home.    Discharge Prescriptions include:  Dilaudid immodium Phenergan zofran   Vida Roller, MD 09/30/11 7846  Vida Roller, MD 09/30/11 [phone]

## 2011-09-29 NOTE — ED Notes (Signed)
Patient c/o right sided abdominal pain, vomiting greenish liquid and multiple episodes of diarrhea.

## 2011-09-30 ENCOUNTER — Emergency Department (HOSPITAL_COMMUNITY): Payer: Medicare Other

## 2011-09-30 DIAGNOSIS — R188 Other ascites: Secondary | ICD-10-CM | POA: Diagnosis not present

## 2011-09-30 DIAGNOSIS — R109 Unspecified abdominal pain: Secondary | ICD-10-CM | POA: Diagnosis not present

## 2011-09-30 DIAGNOSIS — R112 Nausea with vomiting, unspecified: Secondary | ICD-10-CM | POA: Diagnosis not present

## 2011-09-30 LAB — POCT I-STAT, CHEM 8
Chloride: 104 mEq/L (ref 96–112)
HCT: 46 % (ref 36.0–46.0)
Potassium: 3.8 mEq/L (ref 3.5–5.1)

## 2011-09-30 LAB — URINE MICROSCOPIC-ADD ON

## 2011-09-30 LAB — ABO/RH: ABO/RH(D): O POS

## 2011-09-30 LAB — URINALYSIS, ROUTINE W REFLEX MICROSCOPIC
Bilirubin Urine: NEGATIVE
Hgb urine dipstick: NEGATIVE
Ketones, ur: 15 mg/dL — AB
Specific Gravity, Urine: 1.021 (ref 1.005–1.030)
Urobilinogen, UA: 0.2 mg/dL (ref 0.0–1.0)
pH: 8 (ref 5.0–8.0)

## 2011-09-30 LAB — CBC
MCH: 26.9 pg (ref 26.0–34.0)
MCHC: 32.6 g/dL (ref 30.0–36.0)
MCV: 82.3 fL (ref 78.0–100.0)
Platelets: 199 10*3/uL (ref 150–400)
RBC: 5.25 MIL/uL — ABNORMAL HIGH (ref 3.87–5.11)

## 2011-09-30 LAB — PROTIME-INR
INR: 0.89 (ref 0.00–1.49)
Prothrombin Time: 12.2 seconds (ref 11.6–15.2)

## 2011-09-30 LAB — HEPATIC FUNCTION PANEL
Bilirubin, Direct: 0.1 mg/dL (ref 0.0–0.3)
Indirect Bilirubin: 0.4 mg/dL (ref 0.3–0.9)

## 2011-09-30 LAB — TYPE AND SCREEN: Antibody Screen: NEGATIVE

## 2011-09-30 LAB — DIFFERENTIAL
Basophils Relative: 0 % (ref 0–1)
Eosinophils Absolute: 0.3 10*3/uL (ref 0.0–0.7)
Eosinophils Relative: 3 % (ref 0–5)
Lymphs Abs: 0.8 10*3/uL (ref 0.7–4.0)

## 2011-09-30 LAB — LIPASE, BLOOD: Lipase: 38 U/L (ref 11–59)

## 2011-09-30 MED ORDER — ONDANSETRON 4 MG PO TBDP: 4.0000 mg | ORAL_TABLET | Freq: Three times a day (TID) | ORAL | Status: AC | PRN

## 2011-09-30 MED ORDER — HYDROMORPHONE HCL PF 1 MG/ML IJ SOLN
INTRAMUSCULAR | Status: AC
  Administered 2011-09-30: 0.5 mg via INTRAVENOUS
  Filled 2011-09-30: qty 1

## 2011-09-30 MED ORDER — PROMETHAZINE HCL 25 MG PO TABS: 25.0000 mg | ORAL_TABLET | Freq: Four times a day (QID) | ORAL | Status: DC | PRN

## 2011-09-30 MED ORDER — HYDROMORPHONE HCL PF 1 MG/ML IJ SOLN
0.5000 mg | Freq: Once | INTRAMUSCULAR | Status: AC
  Administered 2011-09-30: 0.5 mg via INTRAVENOUS

## 2011-09-30 MED ORDER — LOPERAMIDE HCL 2 MG PO CAPS: 2.0000 mg | ORAL_CAPSULE | Freq: Four times a day (QID) | ORAL | Status: AC | PRN

## 2011-09-30 MED ORDER — HYDROMORPHONE HCL PF 1 MG/ML IJ SOLN
0.5000 mg | Freq: Once | INTRAMUSCULAR | Status: AC
  Administered 2011-09-30: 0.5 mg via INTRAVENOUS
  Filled 2011-09-30: qty 1

## 2011-09-30 MED ORDER — HYDROMORPHONE HCL 2 MG PO TABS: 1.0000 mg | ORAL_TABLET | Freq: Four times a day (QID) | ORAL | Status: AC | PRN

## 2011-09-30 NOTE — ED Notes (Signed)
MD at bedside. 

## 2011-09-30 NOTE — Discharge Instructions (Signed)
Your CT scan did not show a source of your symptoms, your blood and urine tests were normal - please use the medications exactly as prescribed.  You have told us that you have had an allergy to Morphine.  We have given you a medicine that is related to Morphine and you have not had a bad reaction to it.  Thus I will give you a pain medicine for home called Dilaudid - take a half a pill every 6 hours for severe pain.  zofran for nausea - phenergan if you continue to have nausea.  Return to the hospital for severe pain, vomiting that prevents you from taking your medicines or fevers.  You should see your doctor in 24 hours for a recheck if still having symptoms.  See the attached reading instructions.

## 2011-10-01 DIAGNOSIS — C169 Malignant neoplasm of stomach, unspecified: Secondary | ICD-10-CM | POA: Diagnosis not present

## 2011-10-01 DIAGNOSIS — R109 Unspecified abdominal pain: Secondary | ICD-10-CM | POA: Diagnosis not present

## 2011-10-09 ENCOUNTER — Telehealth: Payer: Self-pay | Admitting: Hematology and Oncology

## 2011-10-09 ENCOUNTER — Telehealth: Payer: Self-pay | Admitting: *Deleted

## 2011-10-09 NOTE — Telephone Encounter (Signed)
Received faxed notes from Dr. Junie Spencer office @ [facility].   Left notes on md's desk for review.

## 2011-10-09 NOTE — Telephone Encounter (Signed)
Pt called requesting an appt with Dr. Dalene Carrow.   Spoke with pt and was informed that pt had biopsy done by Dr. Georges Mouse @ [facility] on 09/19/11.   Pt stated she did not have a return appt with Dr. Josetta Huddle.   Pt was instructed by both her primary Dr. Eula Listen and Dr. Josetta Huddle to f/u with Dr. Dalene Carrow again.    Pt stated she was informed that pt has  Small ulcer and  Hernia  From the biopsy. Pt stated she went to ER last week due to abdominal pain.   Pt had IVF and CT scan which showed cysts in kidneys. Informed pt that nurse had requested records from Dr. Josetta Huddle to be faxed to Dr. Dalene Carrow for review.   Pt understood that a scheduler will contact pt with appt after md gives instructions.

## 2011-10-09 NOTE — Telephone Encounter (Signed)
Pt lmonvm yesterday requesting appt w/LO after bx @ [facility]. Message forwarded to Thu. Returned pt's call and lmonvm regarding above. Pt made aware she will be contacted w/appt.

## 2011-10-10 ENCOUNTER — Telehealth: Payer: Self-pay

## 2011-10-10 NOTE — Telephone Encounter (Signed)
Called pt to inform her per Dr. Dalene Carrow, she reviewed the records and biopsy margins were positive, so pt needs to f/u with Dr. Josetta Huddle to discuss results and see if follow up with a surgeon is required, after this, then Dr. Dalene Carrow can see pt.  Pt verbalizes understanding and states she will call his office.  Informed her to call this office if she has any problems.

## 2011-10-21 ENCOUNTER — Telehealth: Payer: Self-pay | Admitting: Hematology and Oncology

## 2011-10-21 NOTE — Telephone Encounter (Signed)
Pt appt. With Dr. Stacey Drain @ [facility] is 10/29/11@10 :00. Records faxed,scans will be fedex'ed. Pt is aware.

## 2011-10-22 ENCOUNTER — Other Ambulatory Visit: Payer: Self-pay | Admitting: Hematology and Oncology

## 2011-10-22 ENCOUNTER — Telehealth: Payer: Self-pay | Admitting: Hematology and Oncology

## 2011-10-22 DIAGNOSIS — E039 Hypothyroidism, unspecified: Secondary | ICD-10-CM | POA: Diagnosis not present

## 2011-10-22 DIAGNOSIS — I10 Essential (primary) hypertension: Secondary | ICD-10-CM | POA: Diagnosis not present

## 2011-10-22 DIAGNOSIS — D3A00 Benign carcinoid tumor of unspecified site: Secondary | ICD-10-CM

## 2011-10-22 DIAGNOSIS — E782 Mixed hyperlipidemia: Secondary | ICD-10-CM | POA: Diagnosis not present

## 2011-10-22 DIAGNOSIS — R109 Unspecified abdominal pain: Secondary | ICD-10-CM | POA: Diagnosis not present

## 2011-10-22 NOTE — Telephone Encounter (Signed)
S/w the pt and she is aware of her oct,nov 2013 appt calendar

## 2011-10-30 ENCOUNTER — Other Ambulatory Visit (HOSPITAL_COMMUNITY): Payer: Self-pay | Admitting: Gastroenterology

## 2011-10-30 DIAGNOSIS — R6881 Early satiety: Secondary | ICD-10-CM | POA: Diagnosis not present

## 2011-10-30 DIAGNOSIS — D3A092 Benign carcinoid tumor of the stomach: Secondary | ICD-10-CM | POA: Diagnosis not present

## 2011-11-10 ENCOUNTER — Ambulatory Visit (HOSPITAL_COMMUNITY)
Admission: RE | Admit: 2011-11-10 | Discharge: 2011-11-10 | Disposition: A | Discharge status: Home / Self Care | Payer: Medicare Other | Source: Ambulatory Visit | Attending: Gastroenterology | Admitting: Gastroenterology

## 2011-11-10 DIAGNOSIS — R109 Unspecified abdominal pain: Secondary | ICD-10-CM | POA: Insufficient documentation

## 2011-11-10 DIAGNOSIS — R142 Eructation: Secondary | ICD-10-CM | POA: Insufficient documentation

## 2011-11-10 DIAGNOSIS — R6881 Early satiety: Secondary | ICD-10-CM | POA: Insufficient documentation

## 2011-11-10 DIAGNOSIS — K219 Gastro-esophageal reflux disease without esophagitis: Secondary | ICD-10-CM | POA: Diagnosis not present

## 2011-11-10 DIAGNOSIS — R141 Gas pain: Secondary | ICD-10-CM | POA: Diagnosis not present

## 2011-11-10 DIAGNOSIS — R112 Nausea with vomiting, unspecified: Secondary | ICD-10-CM | POA: Insufficient documentation

## 2011-11-10 DIAGNOSIS — K3189 Other diseases of stomach and duodenum: Secondary | ICD-10-CM | POA: Diagnosis not present

## 2011-11-10 DIAGNOSIS — R1013 Epigastric pain: Secondary | ICD-10-CM | POA: Diagnosis not present

## 2011-11-10 MED ORDER — TECHNETIUM TC 99M SULFUR COLLOID
2.0000 | Freq: Once | INTRAVENOUS | Status: AC | PRN
  Administered 2011-11-10: 2 via INTRAVENOUS

## 2011-12-08 DIAGNOSIS — R42 Dizziness and giddiness: Secondary | ICD-10-CM | POA: Diagnosis not present

## 2011-12-08 DIAGNOSIS — R350 Frequency of micturition: Secondary | ICD-10-CM | POA: Diagnosis not present

## 2011-12-15 DIAGNOSIS — H4040X0 Glaucoma secondary to eye inflammation, unspecified eye, stage unspecified: Secondary | ICD-10-CM | POA: Diagnosis not present

## 2011-12-15 DIAGNOSIS — H209 Unspecified iridocyclitis: Secondary | ICD-10-CM | POA: Diagnosis not present

## 2011-12-15 DIAGNOSIS — H409 Unspecified glaucoma: Secondary | ICD-10-CM | POA: Diagnosis not present

## 2012-01-22 ENCOUNTER — Other Ambulatory Visit (HOSPITAL_COMMUNITY): Payer: Self-pay | Admitting: Internal Medicine

## 2012-01-22 DIAGNOSIS — J45909 Unspecified asthma, uncomplicated: Secondary | ICD-10-CM

## 2012-01-22 DIAGNOSIS — M109 Gout, unspecified: Secondary | ICD-10-CM | POA: Diagnosis not present

## 2012-01-22 DIAGNOSIS — I10 Essential (primary) hypertension: Secondary | ICD-10-CM | POA: Diagnosis not present

## 2012-01-22 DIAGNOSIS — E039 Hypothyroidism, unspecified: Secondary | ICD-10-CM | POA: Diagnosis not present

## 2012-01-22 DIAGNOSIS — M810 Age-related osteoporosis without current pathological fracture: Secondary | ICD-10-CM | POA: Diagnosis not present

## 2012-01-22 DIAGNOSIS — D3A092 Benign carcinoid tumor of the stomach: Secondary | ICD-10-CM | POA: Diagnosis not present

## 2012-01-22 DIAGNOSIS — E782 Mixed hyperlipidemia: Secondary | ICD-10-CM | POA: Diagnosis not present

## 2012-01-22 DIAGNOSIS — R7309 Other abnormal glucose: Secondary | ICD-10-CM | POA: Diagnosis not present

## 2012-01-29 ENCOUNTER — Ambulatory Visit (HOSPITAL_COMMUNITY)
Admission: RE | Admit: 2012-01-29 | Discharge: 2012-01-29 | Disposition: A | Discharge status: Home / Self Care | Payer: Medicare Other | Source: Ambulatory Visit | Attending: Internal Medicine | Admitting: Internal Medicine

## 2012-01-29 DIAGNOSIS — J45909 Unspecified asthma, uncomplicated: Secondary | ICD-10-CM | POA: Diagnosis not present

## 2012-01-29 MED ORDER — ALBUTEROL SULFATE (5 MG/ML) 0.5% IN NEBU
2.5000 mg | INHALATION_SOLUTION | Freq: Once | RESPIRATORY_TRACT | Status: AC
  Administered 2012-01-29: 2.5 mg via RESPIRATORY_TRACT

## 2012-02-04 ENCOUNTER — Telehealth: Payer: Self-pay | Admitting: *Deleted

## 2012-02-04 ENCOUNTER — Other Ambulatory Visit: Payer: Self-pay | Admitting: *Deleted

## 2012-02-04 NOTE — Telephone Encounter (Signed)
Spoke with pt today and gave pt new date and time for f/u visit on 03/02/12  At  1130 am.   Instructed pt to keep lab appt as scheduled for 02/27/12.   Pt voiced understanding.

## 2012-02-26 DIAGNOSIS — M810 Age-related osteoporosis without current pathological fracture: Secondary | ICD-10-CM | POA: Diagnosis not present

## 2012-02-27 ENCOUNTER — Other Ambulatory Visit (HOSPITAL_BASED_OUTPATIENT_CLINIC_OR_DEPARTMENT_OTHER): Payer: Medicare Other | Admitting: Lab

## 2012-02-27 DIAGNOSIS — D3A00 Benign carcinoid tumor of unspecified site: Secondary | ICD-10-CM

## 2012-02-27 DIAGNOSIS — D3A092 Benign carcinoid tumor of the stomach: Secondary | ICD-10-CM

## 2012-02-27 LAB — COMPREHENSIVE METABOLIC PANEL (CC13)
ALT: 13 U/L (ref 0–55)
AST: 13 U/L (ref 5–34)
Alkaline Phosphatase: 90 U/L (ref 40–150)
CO2: 27 mEq/L (ref 22–29)
Creatinine: 0.8 mg/dL (ref 0.6–1.1)
Sodium: 143 mEq/L (ref 136–145)
Total Bilirubin: 0.5 mg/dL (ref 0.20–1.20)
Total Protein: 6.6 g/dL (ref 6.4–8.3)

## 2012-02-27 LAB — CBC WITH DIFFERENTIAL/PLATELET
BASO%: 0.9 % (ref 0.0–2.0)
EOS%: 4.2 % (ref 0.0–7.0)
Eosinophils Absolute: 0.2 10*3/uL (ref 0.0–0.5)
LYMPH%: 22.6 % (ref 14.0–49.7)
MCHC: 32.9 g/dL (ref 31.5–36.0)
MCV: 83.2 fL (ref 79.5–101.0)
MONO%: 7.8 % (ref 0.0–14.0)
NEUT#: 3.6 10*3/uL (ref 1.5–6.5)
RBC: 4.69 10*6/uL (ref 3.70–5.45)
RDW: 16.2 % — ABNORMAL HIGH (ref 11.2–14.5)
WBC: 5.6 10*3/uL (ref 3.9–10.3)

## 2012-03-02 ENCOUNTER — Ambulatory Visit (HOSPITAL_BASED_OUTPATIENT_CLINIC_OR_DEPARTMENT_OTHER): Payer: Medicare Other | Admitting: Hematology and Oncology

## 2012-03-02 ENCOUNTER — Telehealth: Payer: Self-pay | Admitting: Hematology and Oncology

## 2012-03-02 ENCOUNTER — Encounter: Payer: Self-pay | Admitting: Hematology and Oncology

## 2012-03-02 VITALS — BP 140/85 | HR 81 | Temp 97.4°F | Resp 20 | Ht 63.0 in | Wt 174.1 lb

## 2012-03-02 DIAGNOSIS — D3A092 Benign carcinoid tumor of the stomach: Secondary | ICD-10-CM

## 2012-03-02 NOTE — Progress Notes (Signed)
This office note has been dictated.

## 2012-03-02 NOTE — Patient Instructions (Addendum)
Kellie Ward  [id number]   [facility] - AFTER VISIT SUMMARY   **RECOMMENDATIONS MADE BY THE CONSULTANT AND ANY TEST    RESULTS WILL BE SENT TO YOUR REFERRING DOCTORS.   YOUR EXAM FINDINGS, LABS AND RESULTS WERE DISCUSSED BY YOUR MD TODAY.  YOU CAN GO TO THE [place] WEB SITE FOR INSTRUCTIONS ON HOW TO ASSESS MY CHART FOR ADDITIONAL INFORMATION AS NEEDED.  Your Updated drug allergies are: Allergies as of 03/02/2012 - Review Complete 09/29/2011  Allergen Reaction Noted  . Aspirin Hives 08/07/2011  . Contrast media (iodinated diagnostic agents) Anaphylaxis 08/07/2011  . Honey Hives and Rash 08/07/2011  . Menthol (topical analgesic) Hives 08/07/2011  . Morphine and related Anaphylaxis 08/07/2011  . Iohexol  04/03/2009    Your current list of medications are: Current Outpatient Prescriptions  Medication Sig Dispense Refill  . albuterol (PROVENTIL HFA;VENTOLIN HFA) 108 (90 BASE) MCG/ACT inhaler Inhale 1 puff into the lungs every 6 (six) hours as needed. For shortness of breath      . brinzolamide (AZOPT) 1 % ophthalmic suspension Place 1 drop into the left eye 2 (two) times daily.      . clopidogrel (PLAVIX) 75 MG tablet Take 75 mg by mouth every other day.      . Cyanocobalamin (VITAMIN B-12 IJ) Inject 1,000 mcg as directed every 21 ( twenty-one) days.      Marland Kitchen diltiazem (TIAZAC) 240 MG 24 hr capsule Take 240 mg by mouth daily.      . Fluticasone-Salmeterol (ADVAIR) 250-50 MCG/DOSE AEPB Inhale 1 puff into the lungs 2 (two) times daily.       Marland Kitchen levothyroxine (SYNTHROID, LEVOTHROID) 88 MCG tablet Take 88 mcg by mouth daily.      . Multiple Vitamin (MULTIVITAMIN) tablet Take 1 tablet by mouth daily.      . promethazine (PHENERGAN) 25 MG tablet Take 1 tablet (25 mg total) by mouth every 6 (six) hours as needed for nausea.  12 tablet  0  . Vitamin D, Ergocalciferol, (DRISDOL) 50000 UNITS CAPS Take 50,000 Units by mouth every 7 (seven) days.         INSTRUCTIONS GIVEN AND  DISCUSSED:  See attached schedule   SPECIAL INSTRUCTIONS/FOLLOW-UP:  See above.  I acknowledge that I have been informed and understand all the instructions given to me and received a copy.I know to contact the clinic, my physician, or go to the emergency Department if any problems should occur.   I do not have any more questions at this time, but understand that I may call the [facility] at [phone] during business hours should I have any further questions or need assistance in obtaining follow-up care.

## 2012-03-02 NOTE — Telephone Encounter (Signed)
Pt scheduled for CT with contrast per MD, informed her that pt is allergic to contrast, cause anaphylaxis, Md said that Radiologist will adjust dye, gave pt appt for April 2014, lab Ct then MD, NPO 4 hours prior to CT, per pt she is not allergic to oral contrast

## 2012-03-03 DIAGNOSIS — L940 Localized scleroderma [morphea]: Secondary | ICD-10-CM | POA: Diagnosis not present

## 2012-03-03 DIAGNOSIS — M109 Gout, unspecified: Secondary | ICD-10-CM | POA: Diagnosis not present

## 2012-03-03 NOTE — Progress Notes (Signed)
CC:   Georgann Housekeeper, MD Danise Edge, [name]  IDENTIFYING STATEMENT:  The patient is a 71 year old woman with gastric carcinoid who presented for followup.  INTERVAL HISTORY:  The patient was last seen 6 months ago.  To summarize, she was diagnosed with a localized low-grade gastric carcinoid.  Underwent endoscopic resection at [facility] in April 2013.  She reports she presently feels well.  She has issues with gastric emptying, thus notes increased fullness.  This is being followed closely by Dr. Danise Edge.  She denies fever, chills, or night sweats.  She denies pain.  Octreotide scan performed on 11/10/2011 was negative.  Likewise, a CT of the abdomen and pelvis was also negative.  MEDICATIONS:  Reviewed and updated.  PHYSICAL EXAMINATION:  GEneral:  Alert and oriented x3.  Vitals:  Pulse 81, blood pressure 140/85, temperature 97.4, respirations 20, weight 174 pounds.  HEENT:  Head is atraumatic, normocephalic.  Sclerae anicteric. Mouth moist.  Neck:  Supple.  Chest/CVS:  Unremarkable.  Abdomen:  Soft, nontender.  Bowel sounds present.  Extremities:  No edema.  LABORATORY DATA:  02/27/2012 white cell count 5.6, hemoglobin 12.8, hematocrit 39, platelets 219.  Sodium 143, potassium 4.3, chloride 109, CO2 27, BUN 14, creatinine 0.82, glucose 100.  T Bili 0.5, alkaline phosphatase 90, AST 13, ALT 13.  IMPRESSION AND PLAN:  Ms. Bowers is a 71 year old woman who is status post endoscopic resection on in April 2013 for low-grade gastric carcinoid.  Her exam and blood work indicate no evidence of recurrence at this time.  She will follow up in 6 months' time with a triple phase CT scan of the abdomen, chromogranin level, CBC with comprehensive metabolic panel.  I also understand from patient's daughter-in-law that Dr. Danise Edge plans an endoscopy around that time.  She will follow up with Dr. Donette Larry for her additional medical  issues.    ______________________________ Laurice Record, [name] LIO/MEDQ  D:  03/02/2012  T:  03/03/2012  Job:  [id number]

## 2012-03-04 LAB — CHROMOGRANIN A: Chromogranin A: 20 ng/mL — ABNORMAL HIGH (ref 1.9–15.0)

## 2012-03-05 ENCOUNTER — Ambulatory Visit: Payer: Medicare Other | Admitting: Hematology and Oncology

## 2012-04-05 DIAGNOSIS — R0602 Shortness of breath: Secondary | ICD-10-CM | POA: Diagnosis not present

## 2012-04-05 DIAGNOSIS — L940 Localized scleroderma [morphea]: Secondary | ICD-10-CM | POA: Diagnosis not present

## 2012-04-05 DIAGNOSIS — M109 Gout, unspecified: Secondary | ICD-10-CM | POA: Diagnosis not present

## 2012-04-05 DIAGNOSIS — M545 Low back pain: Secondary | ICD-10-CM | POA: Diagnosis not present

## 2012-04-05 DIAGNOSIS — M1990 Unspecified osteoarthritis, unspecified site: Secondary | ICD-10-CM | POA: Diagnosis not present

## 2012-04-05 DIAGNOSIS — M2550 Pain in unspecified joint: Secondary | ICD-10-CM | POA: Diagnosis not present

## 2012-04-06 DIAGNOSIS — E039 Hypothyroidism, unspecified: Secondary | ICD-10-CM | POA: Diagnosis not present

## 2012-04-06 DIAGNOSIS — E782 Mixed hyperlipidemia: Secondary | ICD-10-CM | POA: Diagnosis not present

## 2012-04-06 DIAGNOSIS — M810 Age-related osteoporosis without current pathological fracture: Secondary | ICD-10-CM | POA: Diagnosis not present

## 2012-04-06 DIAGNOSIS — I10 Essential (primary) hypertension: Secondary | ICD-10-CM | POA: Diagnosis not present

## 2012-04-06 DIAGNOSIS — N182 Chronic kidney disease, stage 2 (mild): Secondary | ICD-10-CM | POA: Diagnosis not present

## 2012-04-06 DIAGNOSIS — J45909 Unspecified asthma, uncomplicated: Secondary | ICD-10-CM | POA: Diagnosis not present

## 2012-06-01 DIAGNOSIS — M109 Gout, unspecified: Secondary | ICD-10-CM | POA: Diagnosis not present

## 2012-06-01 DIAGNOSIS — L940 Localized scleroderma [morphea]: Secondary | ICD-10-CM | POA: Diagnosis not present

## 2012-06-01 DIAGNOSIS — M810 Age-related osteoporosis without current pathological fracture: Secondary | ICD-10-CM | POA: Diagnosis not present

## 2012-06-01 DIAGNOSIS — M545 Low back pain: Secondary | ICD-10-CM | POA: Diagnosis not present

## 2012-06-01 DIAGNOSIS — M1990 Unspecified osteoarthritis, unspecified site: Secondary | ICD-10-CM | POA: Diagnosis not present

## 2012-06-02 ENCOUNTER — Emergency Department (HOSPITAL_COMMUNITY): Payer: Medicare Other

## 2012-06-02 ENCOUNTER — Inpatient Hospital Stay (HOSPITAL_COMMUNITY)
Admission: EM | Admit: 2012-06-02 | Discharge: 2012-06-04 | DRG: 690 | Disposition: A | Discharge status: Home / Self Care | Payer: Medicare Other | Attending: Internal Medicine | Admitting: Internal Medicine

## 2012-06-02 ENCOUNTER — Encounter (HOSPITAL_COMMUNITY): Payer: Self-pay

## 2012-06-02 DIAGNOSIS — Z79899 Other long term (current) drug therapy: Secondary | ICD-10-CM

## 2012-06-02 DIAGNOSIS — N200 Calculus of kidney: Secondary | ICD-10-CM | POA: Diagnosis present

## 2012-06-02 DIAGNOSIS — E039 Hypothyroidism, unspecified: Secondary | ICD-10-CM | POA: Diagnosis not present

## 2012-06-02 DIAGNOSIS — N281 Cyst of kidney, acquired: Secondary | ICD-10-CM | POA: Diagnosis not present

## 2012-06-02 DIAGNOSIS — Z85028 Personal history of other malignant neoplasm of stomach: Secondary | ICD-10-CM

## 2012-06-02 DIAGNOSIS — I10 Essential (primary) hypertension: Secondary | ICD-10-CM | POA: Diagnosis not present

## 2012-06-02 DIAGNOSIS — N10 Acute tubulo-interstitial nephritis: Secondary | ICD-10-CM | POA: Diagnosis not present

## 2012-06-02 DIAGNOSIS — R079 Chest pain, unspecified: Secondary | ICD-10-CM

## 2012-06-02 DIAGNOSIS — R0789 Other chest pain: Secondary | ICD-10-CM | POA: Diagnosis present

## 2012-06-02 DIAGNOSIS — K5900 Constipation, unspecified: Secondary | ICD-10-CM | POA: Diagnosis present

## 2012-06-02 DIAGNOSIS — C169 Malignant neoplasm of stomach, unspecified: Secondary | ICD-10-CM | POA: Diagnosis not present

## 2012-06-02 DIAGNOSIS — N12 Tubulo-interstitial nephritis, not specified as acute or chronic: Secondary | ICD-10-CM

## 2012-06-02 DIAGNOSIS — Z8673 Personal history of transient ischemic attack (TIA), and cerebral infarction without residual deficits: Secondary | ICD-10-CM

## 2012-06-02 DIAGNOSIS — D3A092 Benign carcinoid tumor of the stomach: Secondary | ICD-10-CM | POA: Diagnosis present

## 2012-06-02 DIAGNOSIS — J45909 Unspecified asthma, uncomplicated: Secondary | ICD-10-CM | POA: Diagnosis present

## 2012-06-02 HISTORY — DX: Other specified diseases of liver: K76.89

## 2012-06-02 HISTORY — DX: Cyst of kidney, acquired: N28.1

## 2012-06-02 HISTORY — DX: Basal cell carcinoma of skin, unspecified: C44.91

## 2012-06-02 HISTORY — DX: Malignant (primary) neoplasm, unspecified: C80.1

## 2012-06-02 LAB — CBC WITH DIFFERENTIAL/PLATELET
Basophils Absolute: 0 10*3/uL (ref 0.0–0.1)
Eosinophils Absolute: 0.2 10*3/uL (ref 0.0–0.7)
Eosinophils Relative: 2 % (ref 0–5)
HCT: 41.7 % (ref 36.0–46.0)
Lymphocytes Relative: 13 % (ref 12–46)
MCH: 27.9 pg (ref 26.0–34.0)
MCV: 83.7 fL (ref 78.0–100.0)
Monocytes Absolute: 0.6 10*3/uL (ref 0.1–1.0)
Platelets: 215 10*3/uL (ref 150–400)
RDW: 16 % — ABNORMAL HIGH (ref 11.5–15.5)
WBC: 12.4 10*3/uL — ABNORMAL HIGH (ref 4.0–10.5)

## 2012-06-02 LAB — COMPREHENSIVE METABOLIC PANEL
AST: 18 U/L (ref 0–37)
CO2: 23 mEq/L (ref 19–32)
Calcium: 9.6 mg/dL (ref 8.4–10.5)
Creatinine, Ser: 0.85 mg/dL (ref 0.50–1.10)
GFR calc Af Amer: 77 mL/min — ABNORMAL LOW (ref >90)
GFR calc non Af Amer: 67 mL/min — ABNORMAL LOW (ref >90)
Glucose, Bld: 165 mg/dL — ABNORMAL HIGH (ref 70–99)
Sodium: 139 mEq/L (ref 135–145)
Total Protein: 7.3 g/dL (ref 6.0–8.3)

## 2012-06-02 LAB — URINALYSIS, ROUTINE W REFLEX MICROSCOPIC
Ketones, ur: NEGATIVE mg/dL
Nitrite: NEGATIVE
Protein, ur: NEGATIVE mg/dL

## 2012-06-02 LAB — TROPONIN I: Troponin I: <0.3 ng/mL (ref <0.30)

## 2012-06-02 LAB — PRO B NATRIURETIC PEPTIDE: Pro B Natriuretic peptide (BNP): 11.1 pg/mL (ref 0–125)

## 2012-06-02 MED ORDER — ONDANSETRON HCL 4 MG/2ML IJ SOLN
4.0000 mg | Freq: Once | INTRAMUSCULAR | Status: AC
  Administered 2012-06-02: 4 mg via INTRAVENOUS
  Filled 2012-06-02: qty 2

## 2012-06-02 MED ORDER — DEXTROSE 5 % IV SOLN
1.0000 g | Freq: Once | INTRAVENOUS | Status: AC
  Administered 2012-06-02: 1 g via INTRAVENOUS
  Filled 2012-06-02: qty 10

## 2012-06-02 MED ORDER — HYDROMORPHONE HCL PF 1 MG/ML IJ SOLN
1.0000 mg | Freq: Once | INTRAMUSCULAR | Status: AC
  Administered 2012-06-02: 1 mg via INTRAVENOUS
  Filled 2012-06-02: qty 1

## 2012-06-02 MED ORDER — SODIUM CHLORIDE 0.9 % IV SOLN
Freq: Once | INTRAVENOUS | Status: AC
  Administered 2012-06-02: 21:00:00 via INTRAVENOUS

## 2012-06-02 MED ORDER — HYDROMORPHONE HCL PF 1 MG/ML IJ SOLN
0.5000 mg | Freq: Once | INTRAMUSCULAR | Status: AC
  Administered 2012-06-02: 0.5 mg via INTRAVENOUS
  Filled 2012-06-02: qty 1

## 2012-06-02 NOTE — ED Provider Notes (Signed)
History  This chart was scribed for Kellie Octave, MD by [name], ED Scribe. The patient was seen in room A06C/A06C. Patient's care was started at 2024.  CSN: [id number]  Arrival date & time 06/02/12  2024   First MD Initiated Contact with Patient 06/02/12 2024      Chief Complaint  Patient presents with  . Chest Pain    The history is provided by the patient. No language interpreter was used.     HPI Comments: Kellie Ward is a 72 y.o. female who presents to the Emergency Department complaining of sudden onset moderate to severe, gradually improving, constant RUQ abdominal pain that radiates up to mid sternal chest and through to back. Patient states that pain began after eating dinner while she was washing dishes 1-2 hours ago. Patient states most significant pain in RUQ below right breast and is still radiating through to her back, no pain in chest at this time. Patient states that pain was preceeded by shortness of breath, dizziness, lightheadedness and diaphoresis. There were two episodes of emesis prior to arrival. Patient is also reporting right leg numbness for the past 20-30 mintues. Patient says that she had similar abdominal pain 2 months ago but it did not radiate to her chest. Patient has a medical history of asthma, thyroid disease, hypertension, carcinoma of stomach and TIA. Patient states that carcinoma was excised at [facility], but no excision of any other abdominal organs. Patient is followed by [facility] and states next appointment is in April. Patient denies cardiac history. No history of cholecystectomy.   PCP - Donette Larry   Past Medical History  Diagnosis Date  . Hypertension   . Thyroid disease     Past Surgical History  Procedure Date  . Abdominal hysterectomy   . Glaucoma surgery   . Cataract extraction     No family history on file.  History  Substance Use Topics  . Smoking status: Former Smoker -- 1.5 packs/day for 22 years    Types: Cigarettes   . Smokeless tobacco: Not on file  . Alcohol Use: No    OB History    Grav Para Term Preterm Abortions TAB SAB Ect Mult Living                  Review of Systems A complete 10 system review of systems was obtained and all systems are negative except as noted in the HPI and PMH.   Allergies  Aspirin; Contrast media; Honey; Menthol (topical analgesic); Morphine and related; and Iohexol  Home Medications   Current Outpatient Rx  Name  Route  Sig  Dispense  Refill  . ALBUTEROL SULFATE HFA 108 (90 BASE) MCG/ACT IN AERS   Inhalation   Inhale 1 puff into the lungs every 6 (six) hours as needed. For shortness of breath         . BRINZOLAMIDE 1 % OP SUSP   Left Eye   Place 1 drop into the left eye 2 (two) times daily.         Marland Kitchen CLOPIDOGREL BISULFATE 75 MG PO TABS   Oral   Take 75 mg by mouth every other day.         . COLCHICINE 0.6 MG PO TABS   Oral   Take 0.6 mg by mouth as needed.         Marland Kitchen VITAMIN B-12 IJ   Injection   Inject 1,000 mcg as directed every 21 ( twenty-one) days.         Marland Kitchen  DILTIAZEM HCL ER BEADS 240 MG PO CP24   Oral   Take 240 mg by mouth daily.         Marland Kitchen FLUTICASONE-SALMETEROL 250-50 MCG/DOSE IN AEPB   Inhalation   Inhale 1 puff into the lungs 2 (two) times daily.          Marland Kitchen LEVOTHYROXINE SODIUM 88 MCG PO TABS   Oral   Take 88 mcg by mouth daily.         Marland Kitchen MECLIZINE HCL 25 MG PO TABS   Oral   Take 25 mg by mouth 3 (three) times daily as needed.         Marland Kitchen ONE-DAILY MULTI VITAMINS PO TABS   Oral   Take 1 tablet by mouth daily.         Marland Kitchen ONDANSETRON HCL 4 MG PO TABS   Oral   Take 4 mg by mouth every 8 (eight) hours as needed.         Marland Kitchen PROMETHAZINE HCL 25 MG PO TABS   Oral   Take 1 tablet (25 mg total) by mouth every 6 (six) hours as needed for nausea.   12 tablet   0   . VITAMIN D (ERGOCALCIFEROL) 50000 UNITS PO CAPS   Oral   Take 50,000 Units by mouth every 7 (seven) days.           Triage Vitals: BP 148/83   Pulse 72  Temp 97.9 F (36.6 C) (Oral)  Resp 22  SpO2 100%  Physical Exam  Constitutional: She is oriented to person, place, and time. She appears well-developed and well-nourished.       Uncomfortable.  HENT:  Head: Normocephalic and atraumatic.  Mouth/Throat: Oropharynx is clear and moist.  Eyes: Pupils are equal, round, and reactive to light.  Neck: Neck supple.  Cardiovascular: Normal rate and regular rhythm.   No murmur heard. Pulmonary/Chest: Breath sounds normal. Tachypnea noted. She has no decreased breath sounds. She has no wheezes. She has no rhonchi. She has no rales.  Abdominal: Soft. There is tenderness in the right upper quadrant. There is guarding.  Musculoskeletal: She exhibits no edema.       No peripheral edema. Intact and equal radial, femoral and DP pulses.   Neurological: She is alert and oriented to person, place, and time.       Equal strength in lower extremities.  Skin: Skin is warm and dry. No rash noted.    ED Course  Procedures (including critical care time) DIAGNOSTIC STUDIES: Oxygen Saturation is 100% on room air, normal by my interpretation.    COORDINATION OF CARE: 8:39 PM- Patient informed of current plan for treatment and evaluation and agrees with plan at this time.   Results for orders placed during the hospital encounter of 06/02/12  CBC WITH DIFFERENTIAL      Component Value Range   WBC 12.4 (*) 4.0 - 10.5 K/uL   RBC 4.98  3.87 - 5.11 MIL/uL   Hemoglobin 13.9  12.0 - 15.0 g/dL   HCT 16.1  09.6 - 04.5 %   MCV 83.7  78.0 - 100.0 fL   MCH 27.9  26.0 - 34.0 pg   MCHC 33.3  30.0 - 36.0 g/dL   RDW 40.9 (*) 81.1 - 91.4 %   Platelets 215  150 - 400 K/uL   Neutrophils Relative 79 (*) 43 - 77 %   Neutro Abs 9.8 (*) 1.7 - 7.7 K/uL   Lymphocytes Relative 13  12 -  46 %   Lymphs Abs 1.7  0.7 - 4.0 K/uL   Monocytes Relative 5  3 - 12 %   Monocytes Absolute 0.6  0.1 - 1.0 K/uL   Eosinophils Relative 2  0 - 5 %   Eosinophils Absolute 0.2  0.0  - 0.7 K/uL   Basophils Relative 0  0 - 1 %   Basophils Absolute 0.0  0.0 - 0.1 K/uL  COMPREHENSIVE METABOLIC PANEL      Component Value Range   Sodium 139  135 - 145 mEq/L   Potassium 3.8  3.5 - 5.1 mEq/L   Chloride 102  96 - 112 mEq/L   CO2 23  19 - 32 mEq/L   Glucose, Bld 165 (*) 70 - 99 mg/dL   BUN 19  6 - 23 mg/dL   Creatinine, Ser 4.74  0.50 - 1.10 mg/dL   Calcium 9.6  8.4 - 25.9 mg/dL   Total Protein 7.3  6.0 - 8.3 g/dL   Albumin 3.8  3.5 - 5.2 g/dL   AST 18  0 - 37 U/L   ALT 14  0 - 35 U/L   Alkaline Phosphatase 90  39 - 117 U/L   Total Bilirubin 0.2 (*) 0.3 - 1.2 mg/dL   GFR calc non Af Amer 67 (*) >90 mL/min   GFR calc Af Amer 77 (*) >90 mL/min  LIPASE, BLOOD      Component Value Range   Lipase 45  11 - 59 U/L  TROPONIN I      Component Value Range   Troponin I <0.30  <0.30 ng/mL  URINALYSIS, ROUTINE W REFLEX MICROSCOPIC      Component Value Range   Color, Urine YELLOW  YELLOW   APPearance CLOUDY (*) CLEAR   Specific Gravity, Urine 1.014  1.005 - 1.030   pH 7.0  5.0 - 8.0   Glucose, UA NEGATIVE  NEGATIVE mg/dL   Hgb urine dipstick NEGATIVE  NEGATIVE   Bilirubin Urine NEGATIVE  NEGATIVE   Ketones, ur NEGATIVE  NEGATIVE mg/dL   Protein, ur NEGATIVE  NEGATIVE mg/dL   Urobilinogen, UA 0.2  0.0 - 1.0 mg/dL   Nitrite NEGATIVE  NEGATIVE   Leukocytes, UA LARGE (*) NEGATIVE  PRO B NATRIURETIC PEPTIDE      Component Value Range   Pro B Natriuretic peptide (BNP) 11.1  0 - 125 pg/mL  URINE MICROSCOPIC-ADD ON      Component Value Range   Squamous Epithelial / LPF RARE  RARE   WBC, UA TOO NUMEROUS TO COUNT  <3 WBC/hpf   RBC / HPF 0-2  <3 RBC/hpf   Bacteria, UA FEW (*) RARE   Casts HYALINE CASTS (*) NEGATIVE   US Abdomen Complete  06/02/2012  *RADIOLOGY REPORT*  Clinical Data:  Right upper quadrant abdominal pain  COMPLETE ABDOMINAL ULTRASOUND  Comparison:  09/30/2011 CT  Findings:  Gallbladder:  No gallstones, gallbladder wall thickening, or pericholecystic fluid.   Common bile duct:  Measures 3 mm, within normal limits.  Liver:  No focal lesion identified. Note that an approximately 1 cm hypodensity was noted within each lobe on the prior CT, and indeterminate. Mildly increased/ heterogeneousparenchymal echogenicity.  IVC:  Appears normal.  Pancreas:  Limited visualization due to overlying bowel gas.  A focal abnormality cannot be excluded.  Spleen:  Measures 5 cm oblique.  No focal abnormality.  Right Kidney:  Measures 10.6 cm.  Parapelvic cysts.  Echogenicity within normal limits.  Left Kidney:  Measures 10.1  cm.  Parapelvic cyst.  Normal echogenicity.  Abdominal aorta:  No aneurysm identified.  IMPRESSION: Mild increased/heterogeneous hepatic echogenicity can be seen with underlying steatosis or hepatitis.  Correlate with LFTs.   Original Report Authenticated By: Jearld Lesch, [name]    Dg Chest Portable 1 View  06/02/2012  *RADIOLOGY REPORT*  Clinical Data: Abdominal and chest pain radiating to the back. Hypertension.  Gastric cancer.  PORTABLE CHEST - 1 VIEW  Comparison: 07/28/2011  Findings: Artifact overlies the chest.  Heart size is normal.  The aorta is unfolded.  Lungs are clear.  The vascularity is normal. No effusions.  No acute bony findings.  IMPRESSION: No active disease   Original Report Authenticated By: Paulina Fusi, [name]          No diagnosis found.    MDM  Patient presents with sudden onset of right-sided abdominal and back pain started around 7 PM after eating. It is associated with lightheadedness, nausea, sweating, shortness of breath and dizziness. Pains become localized to the right upper quadrant and right flank area. She has a history of kidney stones, gastric carcinoid tumor status post resection.  Patient is quite uncomfortable and diaphoretic. Her EKG shows no ischemia. She is tender in her right upper quadrant and right flank. She has no neurological or pulse deficits. Dissection considered unlikely but considered. Patient's  pain is right flank she has equal grip strength bilaterally and equal peripheral pulses.  Laboratory studies unrevealing. Urinalysis positive for infection. Gallbladder appears normal on ultrasound. Her pain is controlled upon return from CT scan. There is no evidence of obstruction, hydronephrosis or kidney stone.  She remains nauseated with intermittent episodes of pain. We'll admit for IV antibiotics and continues to the control.     Date: 06/03/2012  Rate: 77  Rhythm: normal sinus rhythm  QRS Axis: normal  Intervals: normal  ST/T Wave abnormalities: normal  Conduction Disutrbances:none  Narrative Interpretation:   Old EKG Reviewed: none available    I personally performed the services described in this documentation, which was scribed in my presence. The recorded information has been reviewed and is accurate.    Kellie Octave, MD 06/03/12 [phone]

## 2012-06-02 NOTE — ED Notes (Signed)
Patient is unable to void at present. 

## 2012-06-02 NOTE — ED Notes (Signed)
MD at bedside. EDP Rancour 

## 2012-06-02 NOTE — ED Notes (Signed)
Per EMS pt from home, reports mid-sternum chest pain starting today at 1900

## 2012-06-02 NOTE — ED Notes (Signed)
Pt reports sudden onset of generalized abd pain radiating up into her mid-sternum chest starting at 1900 this pm, pt reports while washing dishes she became lightheaded, diaphoretic, SOB, and dizzy, followed by abd pain and chest pain. Pt reports her abd pain is more localized at this time under her (R) breast. Pt reports lower and upper back pain and vomited x2. Pt denies recent cough or congestion, pt is flushed, tachypnea, and has tremors

## 2012-06-03 ENCOUNTER — Encounter (HOSPITAL_COMMUNITY): Payer: Self-pay | Admitting: Family Medicine

## 2012-06-03 DIAGNOSIS — N200 Calculus of kidney: Secondary | ICD-10-CM | POA: Diagnosis present

## 2012-06-03 DIAGNOSIS — J45909 Unspecified asthma, uncomplicated: Secondary | ICD-10-CM | POA: Diagnosis present

## 2012-06-03 DIAGNOSIS — R109 Unspecified abdominal pain: Secondary | ICD-10-CM | POA: Diagnosis not present

## 2012-06-03 DIAGNOSIS — N12 Tubulo-interstitial nephritis, not specified as acute or chronic: Secondary | ICD-10-CM | POA: Diagnosis not present

## 2012-06-03 DIAGNOSIS — E039 Hypothyroidism, unspecified: Secondary | ICD-10-CM | POA: Diagnosis not present

## 2012-06-03 DIAGNOSIS — N390 Urinary tract infection, site not specified: Secondary | ICD-10-CM | POA: Diagnosis not present

## 2012-06-03 DIAGNOSIS — K5900 Constipation, unspecified: Secondary | ICD-10-CM | POA: Diagnosis present

## 2012-06-03 DIAGNOSIS — N10 Acute tubulo-interstitial nephritis: Principal | ICD-10-CM

## 2012-06-03 DIAGNOSIS — Z79899 Other long term (current) drug therapy: Secondary | ICD-10-CM | POA: Diagnosis not present

## 2012-06-03 DIAGNOSIS — R0789 Other chest pain: Secondary | ICD-10-CM | POA: Diagnosis present

## 2012-06-03 DIAGNOSIS — I10 Essential (primary) hypertension: Secondary | ICD-10-CM | POA: Diagnosis not present

## 2012-06-03 DIAGNOSIS — Z8673 Personal history of transient ischemic attack (TIA), and cerebral infarction without residual deficits: Secondary | ICD-10-CM | POA: Diagnosis not present

## 2012-06-03 DIAGNOSIS — Z85028 Personal history of other malignant neoplasm of stomach: Secondary | ICD-10-CM | POA: Diagnosis not present

## 2012-06-03 DIAGNOSIS — R079 Chest pain, unspecified: Secondary | ICD-10-CM | POA: Diagnosis not present

## 2012-06-03 LAB — TROPONIN I: Troponin I: <0.3 ng/mL (ref <0.30)

## 2012-06-03 LAB — CBC
HCT: 38.3 % (ref 36.0–46.0)
Hemoglobin: 12.3 g/dL (ref 12.0–15.0)
MCH: 27 pg (ref 26.0–34.0)
MCHC: 32.1 g/dL (ref 30.0–36.0)
RBC: 4.55 MIL/uL (ref 3.87–5.11)

## 2012-06-03 MED ORDER — ONDANSETRON HCL 4 MG/2ML IJ SOLN
4.0000 mg | Freq: Three times a day (TID) | INTRAMUSCULAR | Status: DC | PRN
  Administered 2012-06-03: 4 mg via INTRAVENOUS
  Filled 2012-06-03: qty 2

## 2012-06-03 MED ORDER — LEVOTHYROXINE SODIUM 88 MCG PO TABS
88.0000 ug | ORAL_TABLET | Freq: Every day | ORAL | Status: DC
  Administered 2012-06-03 – 2012-06-04 (×2): 88 ug via ORAL
  Filled 2012-06-03 (×3): qty 1

## 2012-06-03 MED ORDER — ONDANSETRON HCL 4 MG PO TABS: 4.0000 mg | ORAL_TABLET | Freq: Four times a day (QID) | ORAL | Status: DC | PRN

## 2012-06-03 MED ORDER — ACETAMINOPHEN 325 MG PO TABS
650.0000 mg | ORAL_TABLET | Freq: Four times a day (QID) | ORAL | Status: DC | PRN
  Administered 2012-06-03 (×2): 650 mg via ORAL
  Filled 2012-06-03 (×3): qty 2

## 2012-06-03 MED ORDER — DOCUSATE SODIUM 100 MG PO CAPS
100.0000 mg | ORAL_CAPSULE | Freq: Two times a day (BID) | ORAL | Status: DC
  Administered 2012-06-03: 100 mg via ORAL
  Filled 2012-06-03 (×3): qty 1

## 2012-06-03 MED ORDER — ENOXAPARIN SODIUM 40 MG/0.4ML ~~LOC~~ SOLN
40.0000 mg | Freq: Every day | SUBCUTANEOUS | Status: DC
  Administered 2012-06-03: 40 mg via SUBCUTANEOUS
  Filled 2012-06-03 (×2): qty 0.4

## 2012-06-03 MED ORDER — OXYCODONE HCL 5 MG PO TABS: 5.0000 mg | ORAL_TABLET | Freq: Three times a day (TID) | ORAL | Status: DC | PRN

## 2012-06-03 MED ORDER — BRINZOLAMIDE 1 % OP SUSP
1.0000 [drp] | Freq: Two times a day (BID) | OPHTHALMIC | Status: DC
  Administered 2012-06-03 (×2): 1 [drp] via OPHTHALMIC
  Filled 2012-06-03: qty 10

## 2012-06-03 MED ORDER — IRBESARTAN 150 MG PO TABS
150.0000 mg | ORAL_TABLET | Freq: Every day | ORAL | Status: DC
  Administered 2012-06-03: 150 mg via ORAL
  Filled 2012-06-03 (×2): qty 1

## 2012-06-03 MED ORDER — CLOPIDOGREL BISULFATE 75 MG PO TABS
75.0000 mg | ORAL_TABLET | ORAL | Status: DC
  Administered 2012-06-03: 75 mg via ORAL
  Filled 2012-06-03 (×2): qty 1

## 2012-06-03 MED ORDER — TRAMADOL HCL 50 MG PO TABS: 50.0000 mg | ORAL_TABLET | Freq: Four times a day (QID) | ORAL | Status: DC

## 2012-06-03 MED ORDER — DILTIAZEM HCL ER BEADS 240 MG PO CP24
240.0000 mg | ORAL_CAPSULE | Freq: Every day | ORAL | Status: DC
  Administered 2012-06-03: 240 mg via ORAL
  Filled 2012-06-03 (×2): qty 1

## 2012-06-03 MED ORDER — TRAMADOL HCL 50 MG PO TABS
50.0000 mg | ORAL_TABLET | Freq: Four times a day (QID) | ORAL | Status: DC | PRN
  Administered 2012-06-03: 50 mg via ORAL
  Filled 2012-06-03: qty 1

## 2012-06-03 MED ORDER — DEXTROSE 5 % IV SOLN
1.0000 g | INTRAVENOUS | Status: DC
  Administered 2012-06-03: 1 g via INTRAVENOUS
  Filled 2012-06-03 (×2): qty 10

## 2012-06-03 MED ORDER — CIPROFLOXACIN HCL 500 MG PO TABS: 500.0000 mg | ORAL_TABLET | Freq: Two times a day (BID) | ORAL | Status: DC

## 2012-06-03 MED ORDER — CIPROFLOXACIN HCL 250 MG PO TABS: 250.0000 mg | ORAL_TABLET | Freq: Two times a day (BID) | ORAL | Status: DC

## 2012-06-03 MED ORDER — SODIUM CHLORIDE 0.9 % IV SOLN
INTRAVENOUS | Status: DC
  Administered 2012-06-03: 06:00:00 via INTRAVENOUS

## 2012-06-03 MED ORDER — IRBESARTAN-HYDROCHLOROTHIAZIDE 150-12.5 MG PO TABS: 1.0000 | ORAL_TABLET | Freq: Every day | ORAL | Status: AC

## 2012-06-03 MED ORDER — ONDANSETRON HCL 4 MG/2ML IJ SOLN: 4.0000 mg | Freq: Four times a day (QID) | INTRAMUSCULAR | Status: DC | PRN

## 2012-06-03 MED ORDER — DSS 100 MG PO CAPS: 100.0000 mg | ORAL_CAPSULE | Freq: Two times a day (BID) | ORAL | Status: DC

## 2012-06-03 MED ORDER — ACETAMINOPHEN 650 MG RE SUPP: 650.0000 mg | Freq: Four times a day (QID) | RECTAL | Status: DC | PRN

## 2012-06-03 MED ORDER — HYDROMORPHONE HCL PF 1 MG/ML IJ SOLN: 1.0000 mg | INTRAMUSCULAR | Status: DC | PRN

## 2012-06-03 MED ORDER — ALBUTEROL SULFATE HFA 108 (90 BASE) MCG/ACT IN AERS: 1.0000 | INHALATION_SPRAY | Freq: Four times a day (QID) | RESPIRATORY_TRACT | Status: DC | PRN

## 2012-06-03 MED ORDER — SODIUM CHLORIDE 0.9 % IJ SOLN: 3.0000 mL | Freq: Two times a day (BID) | INTRAMUSCULAR | Status: DC

## 2012-06-03 MED ORDER — SODIUM CHLORIDE 0.9 % IV SOLN: INTRAVENOUS | Status: DC

## 2012-06-03 NOTE — H&P (Signed)
History and Physical  Kellie Ward ZOX:[id number] DOB: 03-03-1941 DOA: 06/02/2012  Referring physician: Dr. Manus Gunning PCP: Georgann Housekeeper, MD  Oncologist: Dr. Dalene Carrow  Chief Complaint: Abdominal pain  HPI:  72 year old woman presents the emergency department with sudden onset severe right lower pocket pain, suprapubic pain, flank pain radiating to her chest. Initial evaluation was notable for urinary tract infection, unremarkable basic lab work, unremarkable abdominal ultrasound and CT of the abdomen and pelvis. She was referred for pyelonephritis.  Patient has felt well lately, she is doing the dishes last night when she developed sudden pain in her abdomen, suprapubic area, right lower cautery and, right upper quadrant radiating to her chest and then to her flank. She vomited twice, developed a headache and had some sweating. She has had dysuria for one week.  She reports years ago she had sudden onset of abdominal pain that was excruciating and also radiated elsewhere, was seen in the emergency department and diagnosed with pyelonephritis. She was seen at [facility] and had an MRI done by report which showed cysts in bilateral kidneys. She subsequently had one other episode of pyelonephritis although neither episode as severe as this one.   In the emergency department noted to be afebrile, vital signs stable. Equal blood pressures per emergency department physician. She was treated with Rocephin, Dilaudid. She denies chest pain but continues to have flank pain.  Review of Systems:  Negative for fever, visual changes, sore throat, rash, new muscle aches, chest pain, SOB, bleeding,abdominal pain.  Positive for Chills a couple of days ago, constipated,  dry mouth, frequency, dysuria x1 week  Past Medical History  Diagnosis Date  . Hypertension   . Thyroid disease   . Cancer     stomach  . [place] basal cell carcinoma   . Kidney cysts   . Liver cyst     Past  Surgical History  Procedure Date  . Abdominal hysterectomy   . Glaucoma surgery   . Cataract extraction   . Knee surgery   . Tonsillectomy   . Stomach surgery     [facility]    Social History:  reports that she has quit smoking. Her smoking use included Cigarettes. She has a 33 pack-year smoking history. She does not have any smokeless tobacco history on file. She reports that she does not drink alcohol or use illicit drugs.  Allergies  Allergen Reactions  . Aspirin Hives  . Contrast Media (Iodinated Diagnostic Agents) Anaphylaxis    IODINE  DYE.  Marland Kitchen Honey Hives and Rash  . Menthol (Topical Analgesic) Hives  . Morphine And Related Anaphylaxis  . Iohexol      Code: SOB, Desc: during  [place] "[place]" [place] pt became nauseated, couldn't breathe & became unconscious//a.c., Onset Date: 81191478     Family History  Problem Relation Age of Onset  . Diabetes Mother      Prior to Admission medications   Medication Sig Start Date End Date Taking? Authorizing Provider  albuterol (PROVENTIL HFA;VENTOLIN HFA) 108 (90 BASE) MCG/ACT inhaler Inhale 1 puff into the lungs every 6 (six) hours as needed. For shortness of breath   Yes Historical Provider, MD  brinzolamide (AZOPT) 1 % ophthalmic suspension Place 1 drop into the left eye 2 (two) times daily.   Yes Historical Provider, MD  clopidogrel (PLAVIX) 75 MG tablet Take 75 mg by mouth every other day.   Yes Historical Provider, MD  colchicine 0.6 MG tablet Take 0.6 mg by  mouth as needed.   Yes Historical Provider, MD  Cyanocobalamin (VITAMIN B-12 IJ) Inject 1,000 mcg as directed every 21 ( twenty-one) days.   Yes Historical Provider, MD  diltiazem (TIAZAC) 240 MG 24 hr capsule Take 240 mg by mouth daily.   Yes Historical Provider, MD  levothyroxine (SYNTHROID, LEVOTHROID) 88 MCG tablet Take 88 mcg by mouth daily.   Yes Historical Provider, MD  meclizine (ANTIVERT) 25 MG tablet Take 25 mg by mouth 3 (three) times daily as needed.   Yes  Historical Provider, MD  Multiple Vitamin (MULTIVITAMIN) tablet Take 1 tablet by mouth daily.   Yes Historical Provider, MD  ondansetron (ZOFRAN) 4 MG tablet Take 4 mg by mouth every 8 (eight) hours as needed.   Yes Historical Provider, MD   Physical Exam: Filed Vitals:   06/02/12 2026 06/02/12 2116 06/02/12 2308 06/03/12 0004  BP: 148/83 157/68 133/60 120/62  Pulse: 72 90 78 73  Temp: 97.9 F (36.6 C) 97.9 F (36.6 C) 98.4 F (36.9 C)   TempSrc: Oral Oral Oral   Resp: 22 20 14 16   SpO2: 100% 97% 94% 98%    General:  Examined in the emergency department. Appears calm and comfortable. No acute distress. Eyes: eccentric left pupil (secondary surgery years ago) ENT: grossly normal hearing, lips & tongue Neck: no LAD, masses or thyromegaly Cardiovascular: RRR, no m/r/g. No LE edema. Respiratory: CTA bilaterally, no w/r/r. Normal respiratory effort. Abdomen: Moderate suprapubic pain, right lower quadrant pain and exquisite right flank pain, no rebound. Abdomen benign. Skin: Basal cell carcinoma seen on back. Musculoskeletal: grossly normal tone BUE/BLE Psychiatric: grossly normal mood and affect, speech fluent and appropriate Neurologic: grossly non-focal.  Wt Readings from Last 3 Encounters:  03/02/12 78.971 kg (174 lb 1.6 oz)  08/19/11 80.65 kg (177 lb 12.8 oz)  08/07/11 80.559 kg (177 lb 9.6 oz)    Labs on Admission:  Basic Metabolic Panel:  Lab 06/02/12 4098  NA 139  K 3.8  CL 102  CO2 23  GLUCOSE 165*  BUN 19  CREATININE 0.85  CALCIUM 9.6  MG --  PHOS --    Liver Function Tests:  Lab 06/02/12 2036  AST 18  ALT 14  ALKPHOS 90  BILITOT 0.2*  PROT 7.3  ALBUMIN 3.8    Lab 06/02/12 2036  LIPASE 45  AMYLASE --    CBC:  Lab 06/02/12 2036  WBC 12.4*  NEUTROABS 9.8*  HGB 13.9  HCT 41.7  MCV 83.7  PLT 215    Cardiac Enzymes:  Lab 06/02/12 2036  CKTOTAL --  CKMB --  CKMBINDEX --  TROPONINI <0.30     Basename 06/02/12 2036  PROBNP 11.1     Radiological Exams on Admission: Ct Abdomen Pelvis Wo Contrast  06/02/2012  *RADIOLOGY REPORT*  Clinical Data: Abdominal pain  CT ABDOMEN AND PELVIS WITHOUT CONTRAST  Technique:  Multidetector CT imaging of the abdomen and pelvis was performed following the standard protocol without intravenous contrast.  Comparison: 09/30/2011  Findings: Bibasilar opacities.  Normal heart size.  No pleural or pericardial effusion.  Coronary artery calcification.  Nonspecific posterior mediastinal / para esophageal level soft tissue measuring 1 cm short axis, may reflect an enlarged lymph node, similar to prior  Organ abnormality/lesion detection is limited in the absence of intravenous contrast. Within this limitation, a couple hypodensities within the liver are unchanged.  Unremarkable spleen, pancreas, biliary system, adrenal glands.  Bilateral parapelvic renal cysts are again noted.  No hydronephrosis or hydroureter.  Colonic diverticulosis.  No CT evidence for diverticulitis.  Normal appendix.  No bowel obstruction.  Small hiatal hernia.  No free intraperitoneal air or fluid.  No lymphadenopathy.  Thin-walled bladder.  Absent uterus.  Pelvic floor laxity.  No adnexal mass.  No acute osseous finding.  There is scattered atherosclerotic calcification of the aorta and its branches. No aneurysmal dilatation.  IMPRESSION: No acute abdominopelvic process identified.  Mild bibasilar opacities; atelectasis or infiltrate.  Colonic diverticulosis.   Original Report Authenticated By: Jearld Lesch, [name]    US Abdomen Complete  06/02/2012  *RADIOLOGY REPORT*  Clinical Data:  Right upper quadrant abdominal pain  COMPLETE ABDOMINAL ULTRASOUND  Comparison:  09/30/2011 CT  Findings:  Gallbladder:  No gallstones, gallbladder wall thickening, or pericholecystic fluid.  Common bile duct:  Measures 3 mm, within normal limits.  Liver:  No focal lesion identified. Note that an approximately 1 cm hypodensity was noted within each lobe  on the prior CT, and indeterminate. Mildly increased/ heterogeneousparenchymal echogenicity.  IVC:  Appears normal.  Pancreas:  Limited visualization due to overlying bowel gas.  A focal abnormality cannot be excluded.  Spleen:  Measures 5 cm oblique.  No focal abnormality.  Right Kidney:  Measures 10.6 cm.  Parapelvic cysts.  Echogenicity within normal limits.  Left Kidney:  Measures 10.1 cm.  Parapelvic cyst.  Normal echogenicity.  Abdominal aorta:  No aneurysm identified.  IMPRESSION: Mild increased/heterogeneous hepatic echogenicity can be seen with underlying steatosis or hepatitis.  Correlate with LFTs.   Original Report Authenticated By: Jearld Lesch, [name]    Dg Chest Portable 1 View  06/02/2012  *RADIOLOGY REPORT*  Clinical Data: Abdominal and chest pain radiating to the back. Hypertension.  Gastric cancer.  PORTABLE CHEST - 1 VIEW  Comparison: 07/28/2011  Findings: Artifact overlies the chest.  Heart size is normal.  The aorta is unfolded.  Lungs are clear.  The vascularity is normal. No effusions.  No acute bony findings.  IMPRESSION: No active disease   Original Report Authenticated By: Paulina Fusi, [name]     EKG: Independently reviewed. Sinus rhythm, no acute changes   Principal Problem:  *Acute pyelonephritis Active Problems:  Hypothyroid  Carcinoid tumor of stomach  Hypertension  Chest pain   Assessment/Plan 1. Acute pyelonephritis: Associated with vomiting, right flank, suprapubic pain, leukocytosis and abnormal urinalysis. Empiric antibiotics. Followup culture. 2. Chest pain: Likely radiation from acute sites of pain in the abdomen and flank. Cardiac enzyme negative, EKG reassuring. No signs or symptoms to suggest dissection. 3. Hypertension: Stable. 4. Hypothyroidism: Stable. 5. History of carcinoid tumor of the stomach  She reports she is on Plavix for possibly peripheral vascular disease.  Code Status: Full code Family Communication: None present Disposition  Plan/Anticipated LOS: Admit, 2-3 days  Time spent: 60 minutes  Brendia Sacks, MD  [facility] Pager [phone] 06/03/2012, 12:38 AM

## 2012-06-03 NOTE — Progress Notes (Signed)
Utilization Review Completed.Kellie Ward T1/30/2014   

## 2012-06-03 NOTE — Progress Notes (Signed)
Flow manager was paged second time about pt needing orders for care. Flow manager said that MD on call is aware and will be writing orders when he can, there are still no orders at this time.   Ivor Kishi M

## 2012-06-03 NOTE — Progress Notes (Signed)
Flow manager was paged about pt needing orders for care. Flow manager said that MD on call is aware and will be writing orders when he can.   Kellie Ward M

## 2012-06-03 NOTE — Progress Notes (Signed)
Subjective: Still complains of some discomfort in the abdomen with some nausea CT scan of the abdomen negative of the upper abdomen negative for acute findings  Objective: Vital signs in last 24 hours: Temp:  [97.9 F (36.6 C)-98.4 F (36.9 C)] 98 F (36.7 C) (01/30 0500) Pulse Rate:  [66-90] 66  (01/30 0500) Resp:  [14-22] 18  (01/30 0500) BP: (120-157)/(60-83) 142/64 mmHg (01/30 0500) SpO2:  [94 %-100 %] 96 % (01/30 0500) Weight:  [79.289 kg (174 lb 12.8 oz)] 79.289 kg (174 lb 12.8 oz) (01/30 0228) Weight change:  Last BM Date: 06/02/12  Intake/Output from previous day: 01/29 0701 - 01/30 0700 In: -  Out: 200 [Urine:200] Intake/Output this shift:    General appearance: alert Resp: clear to auscultation bilaterally Cardio: regular rate and rhythm GI: positive bowel sounds some tenderness mild, no rebound or guarding  Lab Results:  Basename 06/03/12 0635 06/02/12 2036  WBC 8.9 12.4*  HGB 12.3 13.9  HCT 38.3 41.7  PLT 200 215   BMET  Basename 06/03/12 0635 06/02/12 2036  NA -- 139  K -- 3.8  CL -- 102  CO2 -- 23  GLUCOSE -- 165*  BUN -- 19  CREATININE 0.90 0.85  CALCIUM -- 9.6    Studies/Results: Ct Abdomen Pelvis Wo Contrast  06/02/2012  *RADIOLOGY REPORT*  Clinical Data: Abdominal pain  CT ABDOMEN AND PELVIS WITHOUT CONTRAST  Technique:  Multidetector CT imaging of the abdomen and pelvis was performed following the standard protocol without intravenous contrast.  Comparison: 09/30/2011  Findings: Bibasilar opacities.  Normal heart size.  No pleural or pericardial effusion.  Coronary artery calcification.  Nonspecific posterior mediastinal / para esophageal level soft tissue measuring 1 cm short axis, may reflect an enlarged lymph node, similar to prior  Organ abnormality/lesion detection is limited in the absence of intravenous contrast. Within this limitation, a couple hypodensities within the liver are unchanged.  Unremarkable spleen, pancreas, biliary system,  adrenal glands.  Bilateral parapelvic renal cysts are again noted.  No hydronephrosis or hydroureter.  Colonic diverticulosis.  No CT evidence for diverticulitis.  Normal appendix.  No bowel obstruction.  Small hiatal hernia.  No free intraperitoneal air or fluid.  No lymphadenopathy.  Thin-walled bladder.  Absent uterus.  Pelvic floor laxity.  No adnexal mass.  No acute osseous finding.  There is scattered atherosclerotic calcification of the aorta and its branches. No aneurysmal dilatation.  IMPRESSION: No acute abdominopelvic process identified.  Mild bibasilar opacities; atelectasis or infiltrate.  Colonic diverticulosis.   Original Report Authenticated By: Jearld Lesch, [name]    US Abdomen Complete  06/02/2012  *RADIOLOGY REPORT*  Clinical Data:  Right upper quadrant abdominal pain  COMPLETE ABDOMINAL ULTRASOUND  Comparison:  09/30/2011 CT  Findings:  Gallbladder:  No gallstones, gallbladder wall thickening, or pericholecystic fluid.  Common bile duct:  Measures 3 mm, within normal limits.  Liver:  No focal lesion identified. Note that an approximately 1 cm hypodensity was noted within each lobe on the prior CT, and indeterminate. Mildly increased/ heterogeneousparenchymal echogenicity.  IVC:  Appears normal.  Pancreas:  Limited visualization due to overlying bowel gas.  A focal abnormality cannot be excluded.  Spleen:  Measures 5 cm oblique.  No focal abnormality.  Right Kidney:  Measures 10.6 cm.  Parapelvic cysts.  Echogenicity within normal limits.  Left Kidney:  Measures 10.1 cm.  Parapelvic cyst.  Normal echogenicity.  Abdominal aorta:  No aneurysm identified.  IMPRESSION: Mild increased/heterogeneous hepatic echogenicity can be seen  with underlying steatosis or hepatitis.  Correlate with LFTs.   Original Report Authenticated By: Jearld Lesch, [name]    Dg Chest Portable 1 View  06/02/2012  *RADIOLOGY REPORT*  Clinical Data: Abdominal and chest pain radiating to the back. Hypertension.  Gastric  cancer.  PORTABLE CHEST - 1 VIEW  Comparison: 07/28/2011  Findings: Artifact overlies the chest.  Heart size is normal.  The aorta is unfolded.  Lungs are clear.  The vascularity is normal. No effusions.  No acute bony findings.  IMPRESSION: No active disease   Original Report Authenticated By: Paulina Fusi, [name]     Medications: I have reviewed the patient's current medications.  Assessment/Plan: Acute right abdominal pain- patient is to assess if his abdominal discomfort acute. Work including a Dietitian negative for gallbladder or kidney Differential includes UTI/pyelonephritis/could have passed kidney stone Continue IV fluids Rocephin today- normal white count no fever History of carcinoid. CT scan negative Mild constipation- start on Colace  Georgann Housekeeper MD 06/03/2012, 8:15 AM

## 2012-06-03 NOTE — Care Management Note (Unsigned)
    Page 1 of 1   06/03/2012     2:49:34 PM   CARE MANAGEMENT NOTE 06/03/2012  Patient:  Kellie Ward, Kellie Ward   Account Number:  [id number]  Date Initiated:  06/03/2012  Documentation initiated by:  Yvetta Drotar  Subjective/Objective Assessment:   PT ADM WITH RUQ PAIN, ACUTE PYELONEPHRITIS.  PTA, PT INDEPENDENT, LIVES ALONE.     Action/Plan:   MET WITH PT TO DISCUSS DC NEEDS.  PT HAS SUPPORTIVE DAUGHTER IN [place] TO ASSIST.  WILL FOLLOW FOR HOME NEEDS.   Anticipated DC Date:  06/04/2012   Anticipated DC Plan:  HOME/SELF CARE      DC Planning Services  CM consult      Choice offered to / List presented to:             Status of service:  In process, will continue to follow Medicare Important Message given?   (If response is "NO", the following Medicare IM given date fields will be blank) Date Medicare IM given:   Date Additional Medicare IM given:    Discharge Disposition:    Per UR Regulation:  Reviewed for med. necessity/level of care/duration of stay  If discussed at Long Length of Stay Meetings, dates discussed:    Comments:

## 2012-06-04 DIAGNOSIS — N390 Urinary tract infection, site not specified: Secondary | ICD-10-CM | POA: Diagnosis not present

## 2012-06-04 DIAGNOSIS — R109 Unspecified abdominal pain: Secondary | ICD-10-CM | POA: Diagnosis not present

## 2012-06-04 LAB — CBC
HCT: 39.6 % (ref 36.0–46.0)
MCV: 85.9 fL (ref 78.0–100.0)
Platelets: 214 10*3/uL (ref 150–400)
RBC: 4.61 MIL/uL (ref 3.87–5.11)
RDW: 16.6 % — ABNORMAL HIGH (ref 11.5–15.5)
WBC: 8.5 10*3/uL (ref 4.0–10.5)

## 2012-06-04 LAB — URINE CULTURE: Colony Count: 50000

## 2012-06-04 MED ORDER — CIPROFLOXACIN HCL 500 MG PO TABS: 500.0000 mg | ORAL_TABLET | Freq: Two times a day (BID) | ORAL | Status: AC

## 2012-06-04 NOTE — Discharge Summary (Signed)
Physician Discharge Summary  Patient ID: Kellie Ward MRN: [id number] DOB/AGE: Nov 30, 1940 72 y.o.  Admit date: 06/02/2012 Discharge date: 06/04/2012  Admission Diagnoses:  Discharge Diagnoses:  Principal Problem:  *Acute pyelonephritis Active Problems:  Hypothyroid  Carcinoid tumor of stomach  Hypertension  Chest pain GERD Asthma History of kidney stone History of bilateral renal cyst no change   Discharged Condition: good  Hospital Course: 72 year old female with history of hypertension carcinoid of the stomach; hypothyroid, asthma Admitted with acute abdominal pain on the right side. Problem #1 abdominal pain: she recall was acute on the right side and also on the front and the flank area. In emergency room she had elevated white count of 12.9. She had ultrasound of the upper abdomen negative for gallbladder or cholecystitis.- Normal ultrasound. She underwent CT scan of the abdomen pelvic which was negative for any acute finding. No evidence of obstruction or diverticulitis;bilateral para-renal cyst no change from previous UA was positive , possible acute pyelonephritis-No hydronephrosis or standing on CT scan. She was started on IV ceftriaxone. Her white count dropped down to normal next day. She was also given IV fluids her abdominal pain started to improve. She tolerated p.o. Intake well without any nausea vomiting No fevers. Blood chemistries normal; a chest x-ray was negative Urine culture was negative Patient was initial Cipro for total of 10 days. She also has history of kidney stone although no evidence of hydroureter. She could have a small stone. Pain controlled with tramadol. Oxycodone for severe pain She had some constipation request to softener. She reported questionable small blood when she was straining stool guaiac rectal exam is brown stool card was sent. Her blood count remained normal hemoglobin was normal abdominal exam was soft without any  tenderness Hypertension patient will continue on her home blood pressure medications Hypothyroid continue her Synthroid Follow up in one week.  Consults: None  Significant Diagnostic Studies: labs: normal blood chemistries and blood counts. UA culture negative microbiology: urine culture: negative and radiology: CXR: normal, KUB: not done, X-Ray: none, MRI: none and CT scan: no acute finding  Treatments: IV hydration and antibiotics: ceftriaxone  Discharge Exam: Blood pressure 126/58, pulse 62, temperature 98.4 F (36.9 C), temperature source Oral, resp. rate 18, height 5' 2.5" (1.588 m), weight 79.289 kg (174 lb 12.8 oz), SpO2 95.00%. Resp: clear to auscultation bilaterally Cardio: regular rate and rhythm GI: soft, non-tender; bowel sounds normal; no masses,  no organomegaly  Disposition: 01-Home or Self Care  Discharge Orders    Future Appointments: Provider: Department: Dept Phone: Center:   08/23/2012 10:30 AM [facility] Only [facility] [phone] None   08/23/2012 11:30 AM [facility] [phone] [place]   09/01/2012 9:00 AM Laurice Record, [facility] [phone] None     Future Orders Please Complete By Expires   Diet - low sodium heart healthy      Diet - low sodium heart healthy      Increase activity slowly      Discharge instructions      Comments:   Liquid diet for 48 hrs- advance as tolerated   Increase activity slowly          Medication List     As of 06/04/2012  8:36 AM    TAKE these medications         albuterol 108 (90 BASE) MCG/ACT inhaler   Commonly known as: PROVENTIL HFA;VENTOLIN HFA  Inhale 1 puff into the lungs every 6 (six) hours as needed. For shortness of breath      brinzolamide 1 % ophthalmic suspension   Commonly known as: AZOPT   Place 1 drop into the left eye 2 (two) times daily.      ciprofloxacin 500 MG tablet   Commonly  known as: CIPRO   Take 1 tablet (500 mg total) by mouth 2 (two) times daily.      clopidogrel 75 MG tablet   Commonly known as: PLAVIX   Take 75 mg by mouth every other day.      colchicine 0.6 MG tablet   Take 0.6 mg by mouth as needed.      diltiazem 240 MG 24 hr capsule   Commonly known as: TIAZAC   Take 240 mg by mouth daily.      DSS 100 MG Caps   Take 100 mg by mouth 2 (two) times daily.      irbesartan-hydrochlorothiazide 150-12.5 MG per tablet   Commonly known as: AVALIDE   Take 1 tablet by mouth daily.      levothyroxine 88 MCG tablet   Commonly known as: SYNTHROID, LEVOTHROID   Take 88 mcg by mouth daily.      meclizine 25 MG tablet   Commonly known as: ANTIVERT   Take 25 mg by mouth 3 (three) times daily as needed.      multivitamin tablet   Take 1 tablet by mouth daily.      ondansetron 4 MG tablet   Commonly known as: ZOFRAN   Take 4 mg by mouth every 8 (eight) hours as needed.      oxyCODONE 5 MG immediate release tablet   Commonly known as: Oxy IR/ROXICODONE   Take 1 tablet (5 mg total) by mouth every 8 (eight) hours as needed for pain.      traMADol 50 MG tablet   Commonly known as: ULTRAM   Take 50 mg by mouth daily as needed. For gout pain      VITAMIN B-12 IJ   Inject 1,000 mcg as directed every 21 ( twenty-one) days.           Follow-up Information    In 5 days to follow up.      Follow up with Georgann Housekeeper, MD.   Contact information:   [address] [place] [address] [phone]        discharge planning plan total 35 minutes  Signed: Gorje Iyer 06/04/2012, 8:36 AM

## 2012-06-05 ENCOUNTER — Telehealth: Payer: Self-pay | Admitting: *Deleted

## 2012-06-05 NOTE — Telephone Encounter (Signed)
Per patient reassignment I have attempted to contact the patient. I have left a message on both the mobile and home number. The left was to contact me either today at [phone] or on Monday at [phone]. Appt for Dr. Dalene Carrow canceled.  JMW

## 2012-06-07 ENCOUNTER — Telehealth: Payer: Self-pay | Admitting: *Deleted

## 2012-06-07 ENCOUNTER — Encounter: Payer: Self-pay | Admitting: *Deleted

## 2012-06-07 NOTE — Telephone Encounter (Signed)
Per patient reassignment I have called and spoke with the patient. I have explained that Dr. Dalene Carrow is no longer with the patient. New appts made and letter mailes. JMW

## 2012-06-10 ENCOUNTER — Encounter: Payer: Self-pay | Admitting: *Deleted

## 2012-06-10 DIAGNOSIS — N12 Tubulo-interstitial nephritis, not specified as acute or chronic: Secondary | ICD-10-CM | POA: Diagnosis not present

## 2012-06-10 DIAGNOSIS — D3A092 Benign carcinoid tumor of the stomach: Secondary | ICD-10-CM | POA: Diagnosis not present

## 2012-06-10 DIAGNOSIS — M810 Age-related osteoporosis without current pathological fracture: Secondary | ICD-10-CM | POA: Diagnosis not present

## 2012-06-12 IMAGING — CT CT ABD-PEL WO/W CM
1 of 4 series · 12 of 32 positions shown, 18 images · IV contrast (omnipaque)
Comparison: CT scan 04/03/2009.

CT CHEST

CLINICAL DATA: History of gastric carcinoid tumor.

CT CHEST, ABDOMEN AND PELVIS WITH CONTRAST
TECHNIQUE: Multidetector CT imaging of the chest, abdomen and
pelvis was performed following the standard protocol during bolus
administration of intravenous contrast.
Contrast: 80mL OMNIPAQUE IOHEXOL 300 MG/ML  SOLN

[Series 5: liver portal venous · axial · portal-venous · 0.72mm/px · z∈[-499,-34]mm · 12 of 111 slices shown, 18 images]
[im 9/111  soft-tissue]
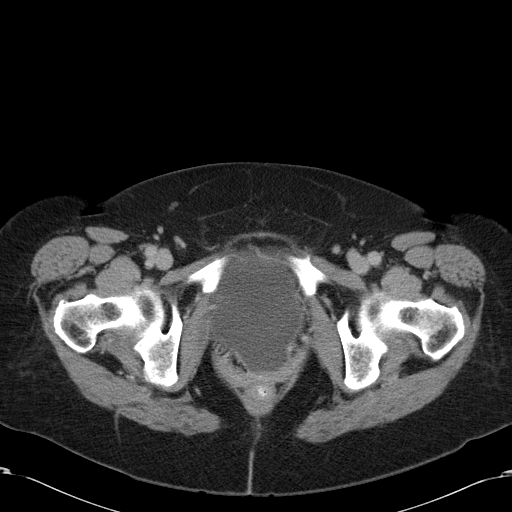
[im 9/111  bone]
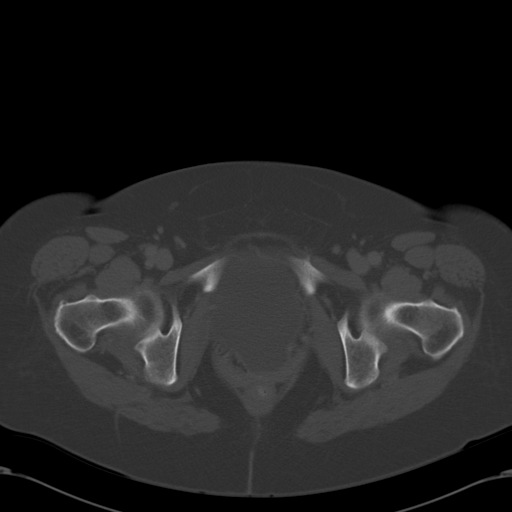
[im 17/111  soft-tissue]
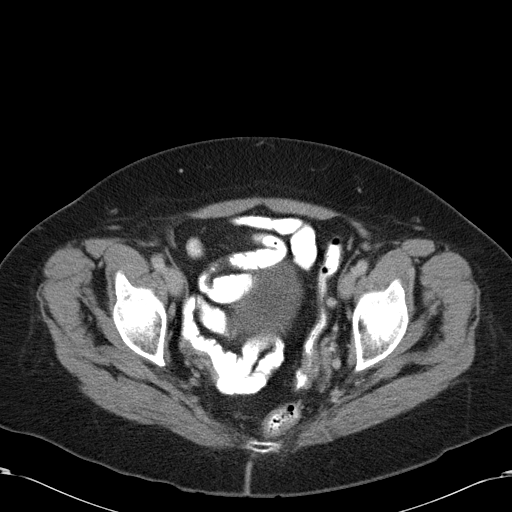
[im 26/111  soft-tissue]
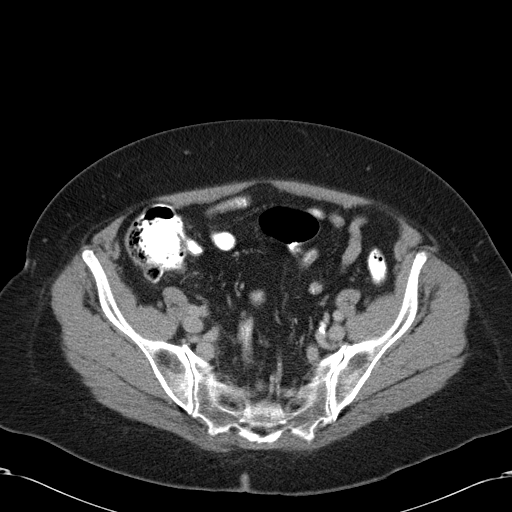
[im 34/111  soft-tissue]
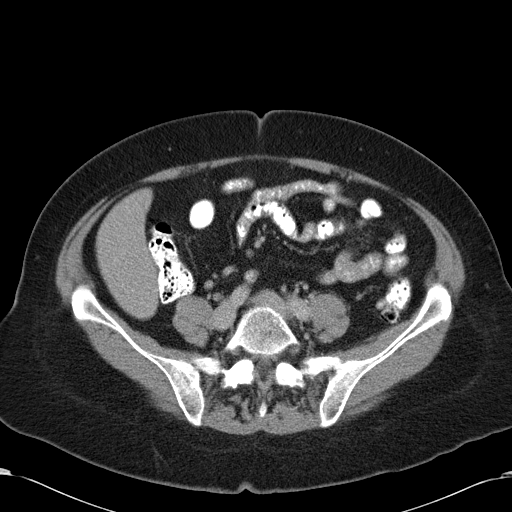
[im 43/111  soft-tissue]
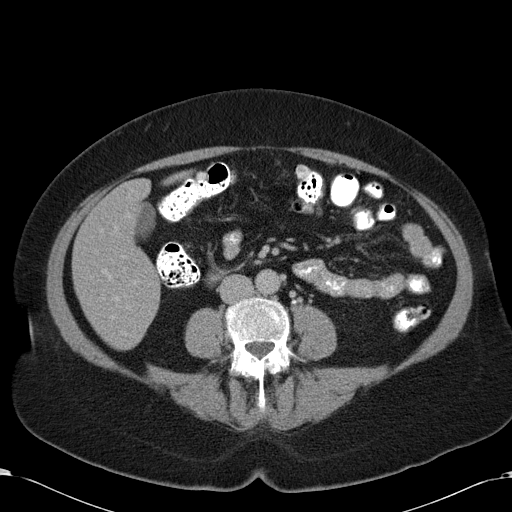
[im 51/111  soft-tissue]
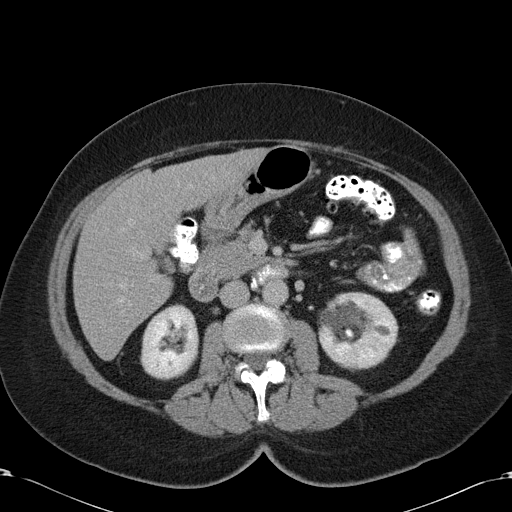
[im 60/111  soft-tissue]
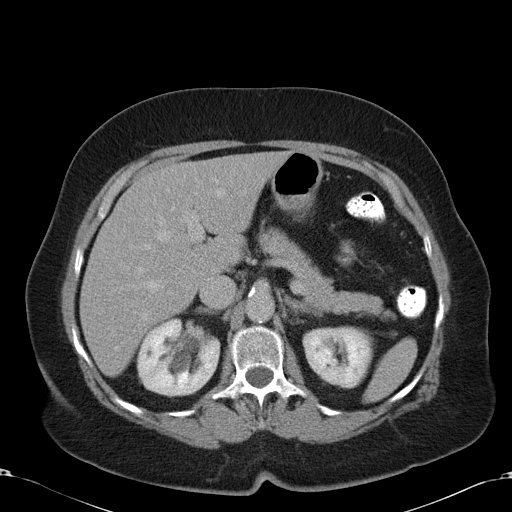
[im 68/111  soft-tissue]
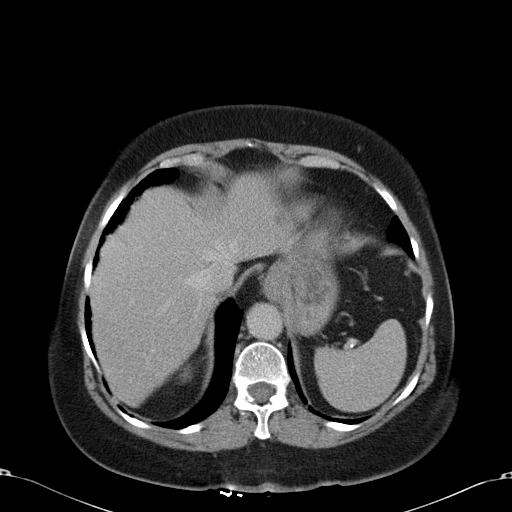
[im 77/111  soft-tissue]
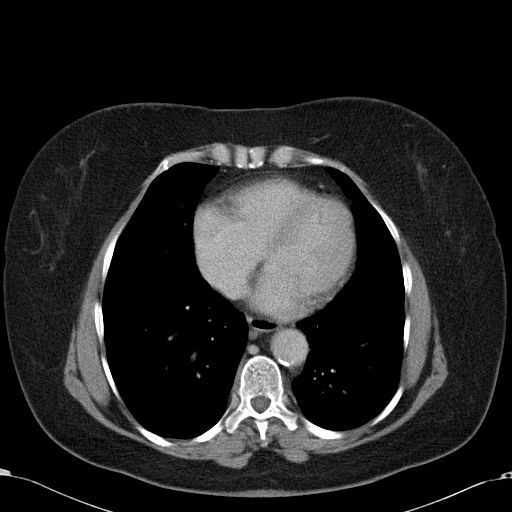
[im 77/111  lung]
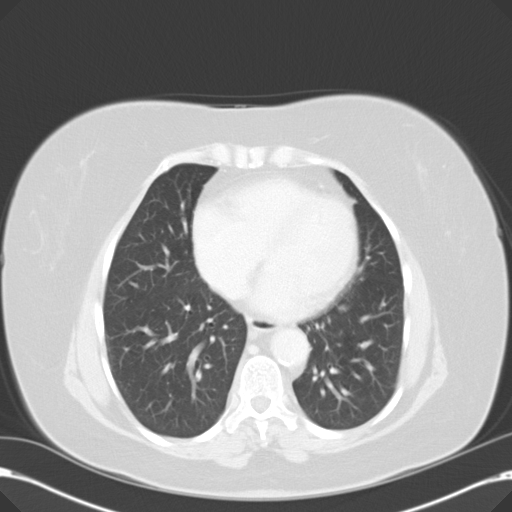
[im 77/111  bone]
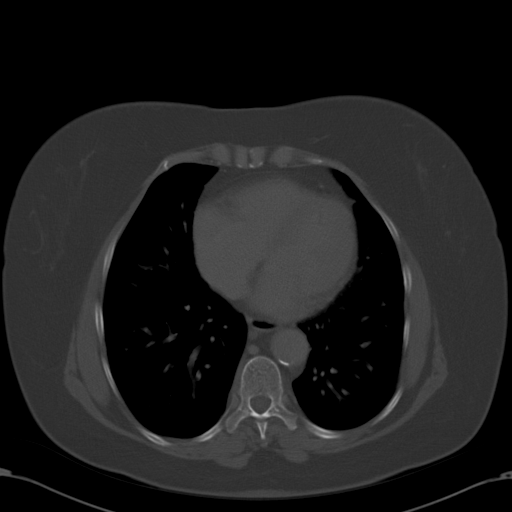
[im 85/111  soft-tissue]
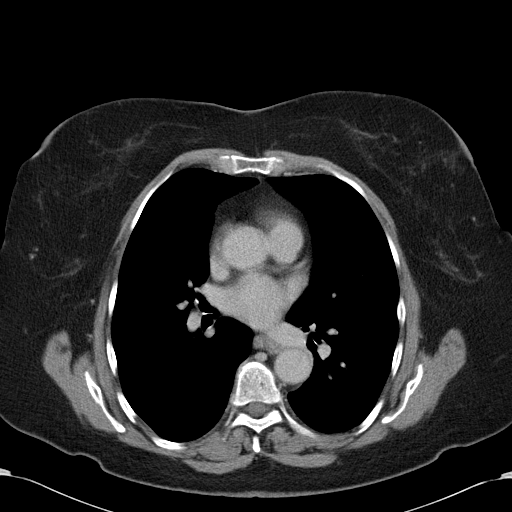
[im 85/111  lung]
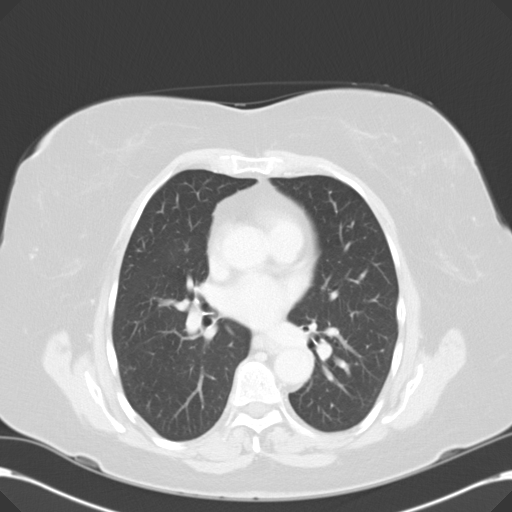
[im 94/111  soft-tissue]
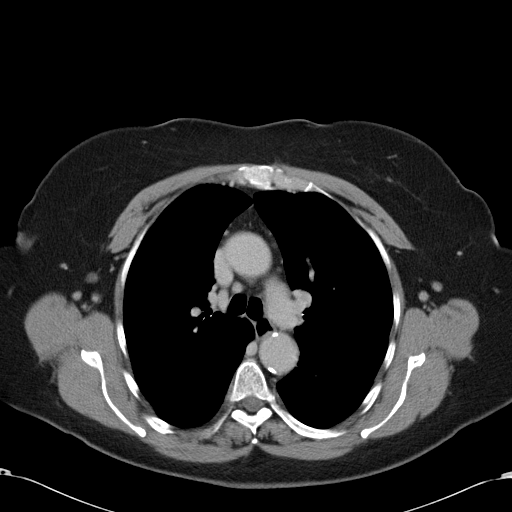
[im 94/111  lung]
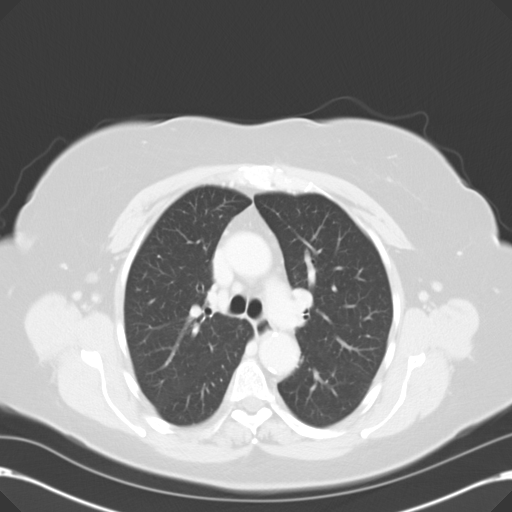
[im 102/111  soft-tissue]
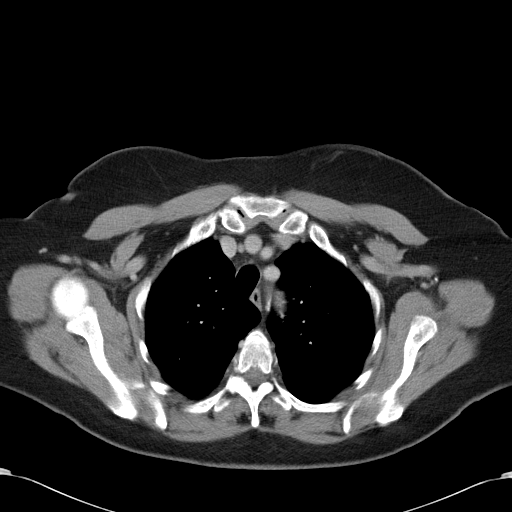
[im 102/111  lung]
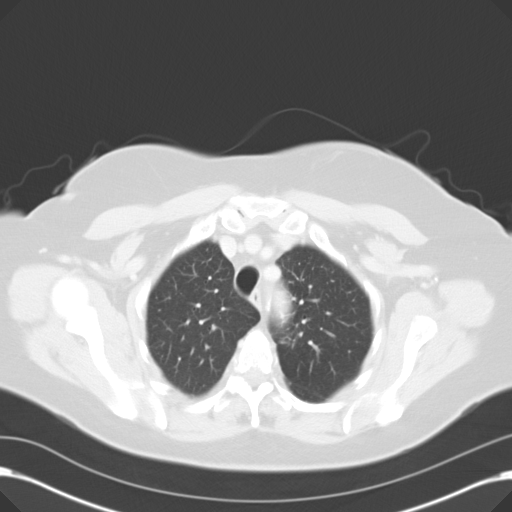

[12 of 32 positions shown; findings below may reference images not displayed]

FINDINGS: The chest wall is unremarkable.  No breast masses,
supraclavicular or axillary lymphadenopathy.  There are small
scattered axillary lymph nodes bilaterally.  The bony thorax is
intact.  No destructive bony lesions or spinal canal compromise.

The heart is normal in size.  No pericardial effusion.  No
mediastinal or hilar lymphadenopathy.  Small scattered lymph nodes
are noted.  The esophagus is normal.  The aorta is normal in
caliber.  No dissection.  Mild atherosclerotic calcifications.

Examination of the lung parenchyma demonstrates no worrisome
pulmonary nodules, pulmonary mass lesions or acute pulmonary
process.  No pleural effusion.
IMPRESSION: Unremarkable CT examination of the chest.  No findings for
metastatic disease.

CT ABDOMEN AND PELVIS
FINDINGS: There are small stable hepatic cysts.  No findings for
hepatic metastasis.  No intrahepatic biliary dilatation.  The
gallbladder is normal.  No common bile duct dilatation.  The
pancreas is normal.  The spleen is normal in size.  No focal
lesions.  The adrenal glands and kidneys are unremarkable and
stable.  There are numerous small renal cysts and bilateral stable
parapelvic cysts.

The stomach, duodenum, small bowel and colon are unremarkable.  No
mass lesions or inflammatory changes.  No mesenteric or
retroperitoneal masses or lymphadenopathy.  No calcified mesenteric
mass.  The aorta is normal in caliber.  Stable atherosclerotic
changes.  The branch vessels are patent.

The bladder is normal.  A small cystocele is noted.  The uterus is
surgically absent.  Both ovaries are still present and appear
normal.  No pelvic mass, adenopathy or free pelvic fluid
collections.  No inguinal mass or hernia.  The bony structures are
intact.
IMPRESSION: Unremarkable and stable CT appearance of the abdomen/pelvis.  No
findings for metastatic disease.

## 2012-06-25 ENCOUNTER — Other Ambulatory Visit: Payer: Self-pay | Admitting: Gastroenterology

## 2012-06-25 ENCOUNTER — Encounter (HOSPITAL_COMMUNITY): Payer: Self-pay | Admitting: *Deleted

## 2012-06-25 NOTE — Pre-Procedure Instructions (Addendum)
Your procedure is scheduled ZO:[id number], March 03,2014 Report to [facility] Admitting at:1300 Call this number if you have problems morning of your procedure:[phone]  Follow all bowel prep instructions per your doctor's orders.  Do not eat or drink anything after midnight the night before your procedure. You may brush your teeth, rinse out your mouth, but no water, no food, no chewing gum, no mints, no candies, no chewing tobacco.     Take these medicines the morning of your procedure with A SIP OF WATER:Diltiazem,Avalideand Levothyroxine,may use Abulterol if needed and use eye drop Azopt 1 %  Stopping Plavix Monday, June 28, 2012 per doctor Johnson's instructions.  Please make arrangements for a responsible person to drive you home after the procedure. You cannot go home by cab/taxi. We recommend you have someone with you at home the first 24 hours after your procedure. Driver for procedure is daughter -in- law Cecil Cobbs [phone]  LEAVE ALL VALUABLES, JEWELRY, BILLFOLD AT HOME.  NO DENTURES, CONTACT LENSES ALLOWED IN THE ENDOSCOPY ROOM.   YOU MAY WEAR DEODORANT, PLEASE REMOVE ALL JEWELRY, WATCHES RINGS, BODY PIERCINGS AND LEAVE AT HOME.   WOMEN: NO MAKE-UP, LOTIONS PERFUMES

## 2012-07-01 ENCOUNTER — Encounter (HOSPITAL_COMMUNITY): Payer: Self-pay | Admitting: Pharmacy Technician

## 2012-07-05 ENCOUNTER — Encounter (HOSPITAL_COMMUNITY)
Admission: RE | Disposition: A | Discharge status: Home / Self Care | Payer: Self-pay | Source: Ambulatory Visit | Attending: Gastroenterology

## 2012-07-05 ENCOUNTER — Encounter (HOSPITAL_COMMUNITY): Payer: Self-pay | Admitting: Certified Registered"

## 2012-07-05 ENCOUNTER — Ambulatory Visit (HOSPITAL_COMMUNITY)
Admission: RE | Admit: 2012-07-05 | Discharge: 2012-07-05 | Disposition: A | Discharge status: Home / Self Care | Payer: Medicare Other | Source: Ambulatory Visit | Attending: Gastroenterology | Admitting: Gastroenterology

## 2012-07-05 ENCOUNTER — Ambulatory Visit (HOSPITAL_COMMUNITY): Payer: Medicare Other | Admitting: Certified Registered"

## 2012-07-05 ENCOUNTER — Encounter (HOSPITAL_COMMUNITY): Payer: Self-pay | Admitting: *Deleted

## 2012-07-05 DIAGNOSIS — Z859 Personal history of malignant neoplasm, unspecified: Secondary | ICD-10-CM | POA: Insufficient documentation

## 2012-07-05 DIAGNOSIS — Z87891 Personal history of nicotine dependence: Secondary | ICD-10-CM | POA: Diagnosis not present

## 2012-07-05 DIAGNOSIS — E039 Hypothyroidism, unspecified: Secondary | ICD-10-CM | POA: Diagnosis not present

## 2012-07-05 DIAGNOSIS — Z79899 Other long term (current) drug therapy: Secondary | ICD-10-CM | POA: Insufficient documentation

## 2012-07-05 DIAGNOSIS — D510 Vitamin B12 deficiency anemia due to intrinsic factor deficiency: Secondary | ICD-10-CM | POA: Insufficient documentation

## 2012-07-05 DIAGNOSIS — M549 Dorsalgia, unspecified: Secondary | ICD-10-CM | POA: Diagnosis not present

## 2012-07-05 DIAGNOSIS — K921 Melena: Secondary | ICD-10-CM | POA: Diagnosis not present

## 2012-07-05 DIAGNOSIS — I10 Essential (primary) hypertension: Secondary | ICD-10-CM | POA: Diagnosis not present

## 2012-07-05 DIAGNOSIS — Z7902 Long term (current) use of antithrombotics/antiplatelets: Secondary | ICD-10-CM | POA: Diagnosis not present

## 2012-07-05 DIAGNOSIS — E7800 Pure hypercholesterolemia, unspecified: Secondary | ICD-10-CM | POA: Diagnosis not present

## 2012-07-05 DIAGNOSIS — H409 Unspecified glaucoma: Secondary | ICD-10-CM | POA: Diagnosis not present

## 2012-07-05 DIAGNOSIS — Z86010 Personal history of colonic polyps: Secondary | ICD-10-CM | POA: Diagnosis not present

## 2012-07-05 DIAGNOSIS — K2940 Chronic atrophic gastritis without bleeding: Secondary | ICD-10-CM | POA: Diagnosis not present

## 2012-07-05 DIAGNOSIS — K5730 Diverticulosis of large intestine without perforation or abscess without bleeding: Secondary | ICD-10-CM | POA: Insufficient documentation

## 2012-07-05 HISTORY — DX: Major depressive disorder, single episode, unspecified: F32.9

## 2012-07-05 HISTORY — DX: Vitamin D deficiency, unspecified: E55.9

## 2012-07-05 HISTORY — DX: Chronic rhinitis: J31.0

## 2012-07-05 HISTORY — DX: Insomnia, unspecified: G47.00

## 2012-07-05 HISTORY — PX: COLONOSCOPY WITH PROPOFOL: SHX5780

## 2012-07-05 HISTORY — DX: Gout, unspecified: M10.9

## 2012-07-05 HISTORY — DX: Unspecified asthma, uncomplicated: J45.909

## 2012-07-05 HISTORY — DX: Dorsalgia, unspecified: M54.9

## 2012-07-05 HISTORY — DX: Hypothyroidism, unspecified: E03.9

## 2012-07-05 HISTORY — DX: Hyperlipidemia, unspecified: E78.5

## 2012-07-05 HISTORY — DX: Other chronic pain: G89.29

## 2012-07-05 HISTORY — DX: Calculus of kidney: N20.0

## 2012-07-05 HISTORY — DX: Cardiac murmur, unspecified: R01.1

## 2012-07-05 HISTORY — DX: Age-related osteoporosis without current pathological fracture: M81.0

## 2012-07-05 HISTORY — DX: Headache: R51

## 2012-07-05 HISTORY — DX: Depression, unspecified: F32.A

## 2012-07-05 HISTORY — PX: ESOPHAGOGASTRODUODENOSCOPY (EGD) WITH PROPOFOL: SHX5813

## 2012-07-05 HISTORY — DX: Anxiety disorder, unspecified: F41.9

## 2012-07-05 HISTORY — DX: Unspecified osteoarthritis, unspecified site: M19.90

## 2012-07-05 SURGERY — ESOPHAGOGASTRODUODENOSCOPY (EGD) WITH PROPOFOL
Anesthesia: Monitor Anesthesia Care

## 2012-07-05 MED ORDER — SODIUM CHLORIDE 0.9 % IV SOLN: INTRAVENOUS | Status: DC

## 2012-07-05 MED ORDER — LACTATED RINGERS IV SOLN
INTRAVENOUS | Status: DC
  Administered 2012-07-05: 12:00:00 via INTRAVENOUS

## 2012-07-05 MED ORDER — PROPOFOL INFUSION 10 MG/ML OPTIME
INTRAVENOUS | Status: DC | PRN
  Administered 2012-07-05: 140 ug/kg/min via INTRAVENOUS
  Administered 2012-07-05: 160 ug/kg/min via INTRAVENOUS

## 2012-07-05 MED ORDER — BUTAMBEN-TETRACAINE-BENZOCAINE 2-2-14 % EX AERO
INHALATION_SPRAY | CUTANEOUS | Status: DC | PRN
  Administered 2012-07-05: 2 via TOPICAL

## 2012-07-05 MED ORDER — ONDANSETRON HCL 4 MG/2ML IJ SOLN
INTRAMUSCULAR | Status: DC | PRN
  Administered 2012-07-05: 4 mg via INTRAVENOUS

## 2012-07-05 MED ORDER — MIDAZOLAM HCL 5 MG/5ML IJ SOLN
INTRAMUSCULAR | Status: DC | PRN
  Administered 2012-07-05: 2 mg via INTRAVENOUS

## 2012-07-05 SURGICAL SUPPLY — 25 items

## 2012-07-05 NOTE — Op Note (Signed)
Procedure: Diagnostic esophagogastroduodenoscopy  Indication: Gastric carcinoid tumor removed approximately one year ago  Endoscopist: Danise Edge  Premedication: Propofol administered by anesthesia  Procedure: The patient was placed in the left lateral decubitus position. The Pentax gastroscope was passed through the posterior hypopharynx into the proximal esophagus without difficulty.  Esophagoscopy: The proximal, mid, and lower segments of the esophageal mucosa appeared normal. The squamocolumnar junction is noted at approximately 36 cm from the incisor teeth.  Gastroscopy: The diaphragmatic hiatus was patulous. Retroflex view of the gastric cardia and fundus was normal. The gastric body, antrum, and pylorus appeared normal.  Duodenoscopy: The duodenal bulb and descending duodenum appeared normal.  Assessment: Normal esophagogastroduodenoscopy post endoscopic mucosal resection of the gastric carcinoid tumor approximately one year ago  Procedure: Diagnostic colonoscopy to evaluate resolved hematochezia  Endoscopist: Danise Edge  Premedication: Propofol administered by anesthesia  Procedure: The patient was placed in the left lateral decubitus position. Anal inspection and digital rectal exam were normal. The Pentax pediatric colonoscope was introduced into the rectum and advanced to the cecum. A normal-appearing ileocecal valve and appendiceal orifice were identified. Colonic preparation for the exam today was good.  The patient has universal colonic diverticulosis without diverticular stricture formation.  Rectum. Normal. Retroflex view of the distal rectum normal.  Sigmoid colon and descending colon. Normal.  Splenic flexure. Normal.  Transverse colon. Normal.  Hepatic flexure. Normal.  Ascending colon. Normal.  Cecum and ileocecal valve. Normal.  Assessment: #1. Universal colonic diverticulosis #2. No endoscopic evidence for the presence of recurrent colorectal  neoplasia  Recommendations: Schedule surveillance colonoscopy in 5 years if the patient's health allows.

## 2012-07-05 NOTE — H&P (Signed)
  Procedure: Diagnostic esophagogastroduodenoscopy and colonoscopy  History: The patient is a 72 year old female born 10/26/1940.  The patient was recently hospitalized to evaluate anterior chest pain and upper abdominal pain. During her hospitalization she had 2 episodes of hematochezia.  On 07/11/2011, the patient underwent a colonoscopy with removal of a small adenomatous rectal polyp. She underwent an esophagogastroduodenoscopy which showed a small carcinoid tumor in her stomach. In April 2013, the patient underwent endoscopic mucosal resection of the gastric carcinoid tumor. In may 2013, gastric biopsies did not reveal residual gastric carcinoid tumor.  The patient has pernicious anemia associated with atrophic gastritis. She takes Plavix on a daily basis.  The patient stopped taking Plavix one week ago and is scheduled to undergo a diagnostic esophagogastroduodenoscopy and colonoscopy.  Chronic medications: Meclizine. Plavix. Azopt eyedrops. Multivitamin. Vitamin D. Levothyroxin. Vitamin B12 injections. Ventolin. Advair Diskus. Zocor. Colchicine. Zolpidem. Tramadol. Avalide. Diltiazem.  Past medical and surgical history: Hypertension. Hypercholesterolemia. Hypothyroidism. Asthma. Rhinitis. Anxiety. Glaucoma. Osteoarthritis. Depression. Gout. Migraine headache syndrome. Benign positional vertigo. Vitamin D. deficiency. Kidney stone. Bilateral kidney cysts. Gastric carcinoid tumor. Tonsillectomy. Total abdominal hysterectomy. Knee surgery. Cataract surgery. T12 hemangioma.  Habits: The patient quit smoking cigarettes in 1990. She does not consume alcohol.  Plan: Proceed with diagnostic esophagogastroduodenoscopy and colonoscopy under propofol sedation.

## 2012-07-05 NOTE — Transfer of Care (Signed)
Immediate Anesthesia Transfer of Care Note  Patient: Kellie Ward  Procedure(s) Performed: Procedure(s): ESOPHAGOGASTRODUODENOSCOPY (EGD) WITH PROPOFOL (N/A) COLONOSCOPY WITH PROPOFOL (N/A)  Patient Location: PACU  Anesthesia Type:MAC  Level of Consciousness: awake, oriented and patient cooperative  Airway & Oxygen Therapy: Patient Spontanous Breathing and Patient connected to nasal cannula oxygen  Post-op Assessment: Report given to PACU RN, Post -op Vital signs reviewed and stable and Patient moving all extremities X 4  Post vital signs: Reviewed and stable  Complications: No apparent anesthesia complications

## 2012-07-05 NOTE — Anesthesia Postprocedure Evaluation (Signed)
  Anesthesia Post-op Note  Patient: Forensic psychologist  Procedure(s) Performed: Procedure(s) (LRB): ESOPHAGOGASTRODUODENOSCOPY (EGD) WITH PROPOFOL (N/A) COLONOSCOPY WITH PROPOFOL (N/A)  Patient Location: PACU  Anesthesia Type: MAC  Level of Consciousness: awake and alert   Airway and Oxygen Therapy: Patient Spontanous Breathing  Post-op Pain: mild  Post-op Assessment: Post-op Vital signs reviewed, Patient's Cardiovascular Status Stable, Respiratory Function Stable, Patent Airway and No signs of Nausea or vomiting  Last Vitals:  Filed Vitals:   07/05/12 1440  BP: 126/70  Temp:   Resp: 14    Post-op Vital Signs: stable   Complications: No apparent anesthesia complications

## 2012-07-05 NOTE — Anesthesia Preprocedure Evaluation (Signed)
Anesthesia Evaluation  Patient identified by MRN, date of birth, ID band Patient awake    Reviewed: Allergy & Precautions, H&P , NPO status , Patient's Chart, lab work & pertinent test results  Airway Mallampati: II TM Distance: >3 FB Neck ROM: Full    Dental no notable dental hx.    Pulmonary asthma ,  Took inhalers today. breath sounds clear to auscultation  Pulmonary exam normal       Cardiovascular hypertension, Pt. on medications + Valvular Problems/Murmurs Rhythm:Regular Rate:Normal  On Plavix for poor circulation in her legs. Denies significant heart disease.   Neuro/Psych  Headaches, PSYCHIATRIC DISORDERS Anxiety Depression    GI/Hepatic negative GI ROS, Neg liver ROS,   Endo/Other  negative endocrine ROSHypothyroidism   Renal/GU Renal disease  negative genitourinary   Musculoskeletal negative musculoskeletal ROS (+)   Abdominal   Peds negative pediatric ROS (+)  Hematology negative hematology ROS (+)   Anesthesia Other Findings   Reproductive/Obstetrics negative OB ROS                           Anesthesia Physical Anesthesia Plan  ASA: II  Anesthesia Plan: MAC   Post-op Pain Management:    Induction: Intravenous  Airway Management Planned:   Additional Equipment:   Intra-op Plan:   Post-operative Plan:   Informed Consent: I have reviewed the patients History and Physical, chart, labs and discussed the procedure including the risks, benefits and alternatives for the proposed anesthesia with the patient or authorized representative who has indicated his/her understanding and acceptance.   Dental advisory given  Plan Discussed with: CRNA  Anesthesia Plan Comments:         Anesthesia Quick Evaluation

## 2012-07-06 ENCOUNTER — Encounter (HOSPITAL_COMMUNITY): Payer: Self-pay | Admitting: Gastroenterology

## 2012-07-15 DIAGNOSIS — I10 Essential (primary) hypertension: Secondary | ICD-10-CM | POA: Diagnosis not present

## 2012-07-15 DIAGNOSIS — R7309 Other abnormal glucose: Secondary | ICD-10-CM | POA: Diagnosis not present

## 2012-07-15 DIAGNOSIS — F411 Generalized anxiety disorder: Secondary | ICD-10-CM | POA: Diagnosis not present

## 2012-07-15 DIAGNOSIS — Z1331 Encounter for screening for depression: Secondary | ICD-10-CM | POA: Diagnosis not present

## 2012-07-15 DIAGNOSIS — E782 Mixed hyperlipidemia: Secondary | ICD-10-CM | POA: Diagnosis not present

## 2012-07-15 DIAGNOSIS — E538 Deficiency of other specified B group vitamins: Secondary | ICD-10-CM | POA: Diagnosis not present

## 2012-07-15 DIAGNOSIS — E039 Hypothyroidism, unspecified: Secondary | ICD-10-CM | POA: Diagnosis not present

## 2012-08-19 ENCOUNTER — Other Ambulatory Visit: Payer: Self-pay | Admitting: Oncology

## 2012-08-19 DIAGNOSIS — D3A092 Benign carcinoid tumor of the stomach: Secondary | ICD-10-CM

## 2012-08-20 ENCOUNTER — Telehealth: Payer: Self-pay | Admitting: Oncology

## 2012-08-23 ENCOUNTER — Ambulatory Visit (HOSPITAL_COMMUNITY)
Admission: RE | Admit: 2012-08-23 | Discharge: 2012-08-23 | Disposition: A | Discharge status: Home / Self Care | Payer: Medicare Other | Source: Ambulatory Visit | Attending: Hematology and Oncology | Admitting: Hematology and Oncology

## 2012-08-23 ENCOUNTER — Other Ambulatory Visit: Payer: Medicare Other

## 2012-08-31 DIAGNOSIS — M79609 Pain in unspecified limb: Secondary | ICD-10-CM | POA: Diagnosis not present

## 2012-08-31 DIAGNOSIS — R350 Frequency of micturition: Secondary | ICD-10-CM | POA: Diagnosis not present

## 2012-08-31 DIAGNOSIS — M25569 Pain in unspecified knee: Secondary | ICD-10-CM | POA: Diagnosis not present

## 2012-08-31 DIAGNOSIS — M109 Gout, unspecified: Secondary | ICD-10-CM | POA: Diagnosis not present

## 2012-09-01 ENCOUNTER — Ambulatory Visit: Payer: TRICARE For Life (TFL) | Admitting: Oncology

## 2012-09-01 ENCOUNTER — Telehealth: Payer: Self-pay | Admitting: *Deleted

## 2012-09-01 ENCOUNTER — Ambulatory Visit: Payer: Medicare Other | Admitting: Hematology and Oncology

## 2012-09-01 ENCOUNTER — Encounter: Payer: Self-pay | Admitting: *Deleted

## 2012-09-01 NOTE — Telephone Encounter (Signed)
sw pt to r/s her appt. Pt do not want to r/s at this time because she is trying to travel. She informed me that she would call back when she is ready to r/s....td

## 2012-10-25 ENCOUNTER — Ambulatory Visit
Admission: RE | Admit: 2012-10-25 | Discharge: 2012-10-25 | Disposition: A | Discharge status: Home / Self Care | Payer: TRICARE For Life (TFL) | Source: Ambulatory Visit | Attending: Rheumatology | Admitting: Rheumatology

## 2012-10-25 ENCOUNTER — Other Ambulatory Visit: Payer: Self-pay | Admitting: Rheumatology

## 2012-10-25 DIAGNOSIS — M47817 Spondylosis without myelopathy or radiculopathy, lumbosacral region: Secondary | ICD-10-CM | POA: Diagnosis not present

## 2012-10-25 DIAGNOSIS — M545 Low back pain, unspecified: Secondary | ICD-10-CM

## 2012-10-25 DIAGNOSIS — M25469 Effusion, unspecified knee: Secondary | ICD-10-CM | POA: Diagnosis not present

## 2012-10-25 DIAGNOSIS — IMO0002 Reserved for concepts with insufficient information to code with codable children: Secondary | ICD-10-CM | POA: Diagnosis not present

## 2012-11-23 DIAGNOSIS — IMO0002 Reserved for concepts with insufficient information to code with codable children: Secondary | ICD-10-CM | POA: Diagnosis not present

## 2012-11-23 DIAGNOSIS — M545 Low back pain: Secondary | ICD-10-CM | POA: Diagnosis not present

## 2012-11-23 DIAGNOSIS — M25569 Pain in unspecified knee: Secondary | ICD-10-CM | POA: Diagnosis not present

## 2012-11-23 DIAGNOSIS — M722 Plantar fascial fibromatosis: Secondary | ICD-10-CM | POA: Diagnosis not present

## 2012-12-14 DIAGNOSIS — M722 Plantar fascial fibromatosis: Secondary | ICD-10-CM | POA: Diagnosis not present

## 2012-12-15 DIAGNOSIS — D510 Vitamin B12 deficiency anemia due to intrinsic factor deficiency: Secondary | ICD-10-CM | POA: Diagnosis not present

## 2012-12-15 DIAGNOSIS — I10 Essential (primary) hypertension: Secondary | ICD-10-CM | POA: Diagnosis not present

## 2012-12-15 DIAGNOSIS — N390 Urinary tract infection, site not specified: Secondary | ICD-10-CM | POA: Diagnosis not present

## 2012-12-15 DIAGNOSIS — K219 Gastro-esophageal reflux disease without esophagitis: Secondary | ICD-10-CM | POA: Diagnosis not present

## 2012-12-31 DIAGNOSIS — H4040X0 Glaucoma secondary to eye inflammation, unspecified eye, stage unspecified: Secondary | ICD-10-CM | POA: Diagnosis not present

## 2013-01-10 DIAGNOSIS — H209 Unspecified iridocyclitis: Secondary | ICD-10-CM | POA: Diagnosis not present

## 2013-01-10 DIAGNOSIS — H4040X0 Glaucoma secondary to eye inflammation, unspecified eye, stage unspecified: Secondary | ICD-10-CM | POA: Diagnosis not present

## 2013-01-10 DIAGNOSIS — H409 Unspecified glaucoma: Secondary | ICD-10-CM | POA: Diagnosis not present

## 2013-01-11 DIAGNOSIS — M659 Synovitis and tenosynovitis, unspecified: Secondary | ICD-10-CM | POA: Diagnosis not present

## 2013-01-11 DIAGNOSIS — M722 Plantar fascial fibromatosis: Secondary | ICD-10-CM | POA: Diagnosis not present

## 2013-01-18 ENCOUNTER — Other Ambulatory Visit: Payer: Self-pay | Admitting: Internal Medicine

## 2013-01-18 DIAGNOSIS — Z0000 Encounter for general adult medical examination without abnormal findings: Secondary | ICD-10-CM | POA: Diagnosis not present

## 2013-01-18 DIAGNOSIS — E039 Hypothyroidism, unspecified: Secondary | ICD-10-CM | POA: Diagnosis not present

## 2013-01-18 DIAGNOSIS — M7660 Achilles tendinitis, unspecified leg: Secondary | ICD-10-CM | POA: Diagnosis not present

## 2013-01-18 DIAGNOSIS — D510 Vitamin B12 deficiency anemia due to intrinsic factor deficiency: Secondary | ICD-10-CM | POA: Diagnosis not present

## 2013-01-18 DIAGNOSIS — J45909 Unspecified asthma, uncomplicated: Secondary | ICD-10-CM | POA: Diagnosis not present

## 2013-01-18 DIAGNOSIS — R7309 Other abnormal glucose: Secondary | ICD-10-CM | POA: Diagnosis not present

## 2013-01-18 DIAGNOSIS — M545 Low back pain: Secondary | ICD-10-CM | POA: Diagnosis not present

## 2013-01-18 DIAGNOSIS — Z1231 Encounter for screening mammogram for malignant neoplasm of breast: Secondary | ICD-10-CM

## 2013-01-18 DIAGNOSIS — E782 Mixed hyperlipidemia: Secondary | ICD-10-CM | POA: Diagnosis not present

## 2013-01-18 DIAGNOSIS — N182 Chronic kidney disease, stage 2 (mild): Secondary | ICD-10-CM | POA: Diagnosis not present

## 2013-01-18 DIAGNOSIS — Z1331 Encounter for screening for depression: Secondary | ICD-10-CM | POA: Diagnosis not present

## 2013-01-18 DIAGNOSIS — I10 Essential (primary) hypertension: Secondary | ICD-10-CM | POA: Diagnosis not present

## 2013-01-18 DIAGNOSIS — K219 Gastro-esophageal reflux disease without esophagitis: Secondary | ICD-10-CM | POA: Diagnosis not present

## 2013-01-18 DIAGNOSIS — M25569 Pain in unspecified knee: Secondary | ICD-10-CM | POA: Diagnosis not present

## 2013-02-07 ENCOUNTER — Ambulatory Visit
Admission: RE | Admit: 2013-02-07 | Discharge: 2013-02-07 | Disposition: A | Discharge status: Home / Self Care | Payer: Medicare Other | Source: Ambulatory Visit | Attending: Internal Medicine | Admitting: Internal Medicine

## 2013-02-07 DIAGNOSIS — Z1231 Encounter for screening mammogram for malignant neoplasm of breast: Secondary | ICD-10-CM | POA: Diagnosis not present

## 2013-02-09 ENCOUNTER — Other Ambulatory Visit: Payer: Self-pay | Admitting: Internal Medicine

## 2013-02-09 DIAGNOSIS — R928 Other abnormal and inconclusive findings on diagnostic imaging of breast: Secondary | ICD-10-CM

## 2013-02-23 ENCOUNTER — Ambulatory Visit
Admission: RE | Admit: 2013-02-23 | Discharge: 2013-02-23 | Disposition: A | Discharge status: Home / Self Care | Payer: Medicare Other | Source: Ambulatory Visit | Attending: Internal Medicine | Admitting: Internal Medicine

## 2013-02-23 DIAGNOSIS — R928 Other abnormal and inconclusive findings on diagnostic imaging of breast: Secondary | ICD-10-CM

## 2013-02-23 DIAGNOSIS — N6009 Solitary cyst of unspecified breast: Secondary | ICD-10-CM | POA: Diagnosis not present

## 2013-03-16 DIAGNOSIS — R51 Headache: Secondary | ICD-10-CM | POA: Diagnosis not present

## 2013-04-03 IMAGING — CR DG CHEST 1V PORT
1 series · 1 of 1 positions shown · non-contrast
Comparison: 07/28/2011

CLINICAL DATA: Abdominal and chest pain radiating to the back.
Hypertension.  Gastric cancer.

PORTABLE CHEST - 1 VIEW

[AP]
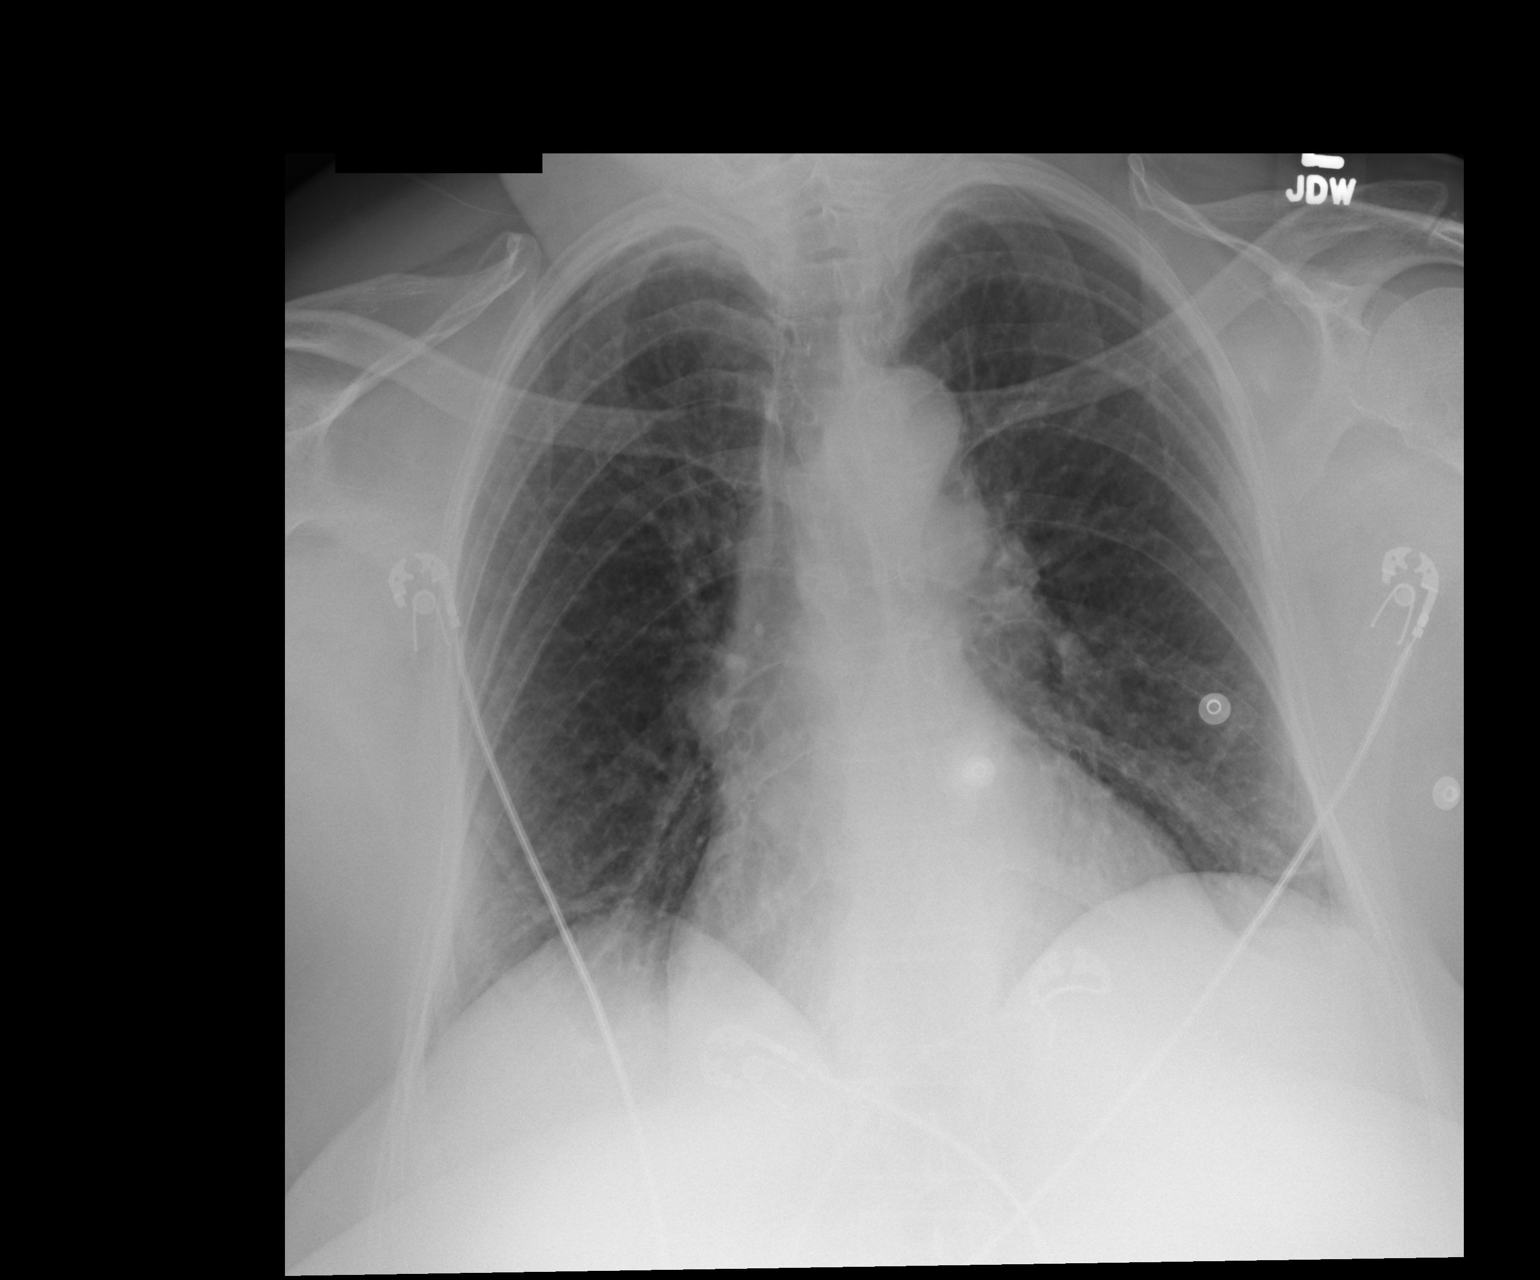

[1 of 1 positions shown; findings below may reference images not displayed]

FINDINGS: Artifact overlies the chest.  Heart size is normal.  The
aorta is unfolded.  Lungs are clear.  The vascularity is normal.
No effusions.  No acute bony findings.
IMPRESSION: No active disease

## 2013-04-14 DIAGNOSIS — D510 Vitamin B12 deficiency anemia due to intrinsic factor deficiency: Secondary | ICD-10-CM | POA: Diagnosis not present

## 2013-04-14 DIAGNOSIS — E119 Type 2 diabetes mellitus without complications: Secondary | ICD-10-CM | POA: Diagnosis not present

## 2013-04-14 DIAGNOSIS — M549 Dorsalgia, unspecified: Secondary | ICD-10-CM | POA: Diagnosis not present

## 2013-04-14 DIAGNOSIS — I10 Essential (primary) hypertension: Secondary | ICD-10-CM | POA: Diagnosis not present

## 2013-06-02 DIAGNOSIS — M25569 Pain in unspecified knee: Secondary | ICD-10-CM | POA: Diagnosis not present

## 2013-06-02 DIAGNOSIS — E039 Hypothyroidism, unspecified: Secondary | ICD-10-CM | POA: Diagnosis not present

## 2013-06-02 DIAGNOSIS — I10 Essential (primary) hypertension: Secondary | ICD-10-CM | POA: Diagnosis not present

## 2013-06-02 DIAGNOSIS — E782 Mixed hyperlipidemia: Secondary | ICD-10-CM | POA: Diagnosis not present

## 2013-06-02 DIAGNOSIS — L940 Localized scleroderma [morphea]: Secondary | ICD-10-CM | POA: Diagnosis not present

## 2013-06-02 DIAGNOSIS — J45909 Unspecified asthma, uncomplicated: Secondary | ICD-10-CM | POA: Diagnosis not present

## 2013-06-02 DIAGNOSIS — E119 Type 2 diabetes mellitus without complications: Secondary | ICD-10-CM | POA: Diagnosis not present

## 2013-06-04 ENCOUNTER — Encounter: Payer: Self-pay | Admitting: Orthopedic Surgery

## 2013-06-27 DIAGNOSIS — H4040X0 Glaucoma secondary to eye inflammation, unspecified eye, stage unspecified: Secondary | ICD-10-CM | POA: Diagnosis not present

## 2013-06-27 DIAGNOSIS — H52209 Unspecified astigmatism, unspecified eye: Secondary | ICD-10-CM | POA: Diagnosis not present

## 2013-06-27 DIAGNOSIS — H524 Presbyopia: Secondary | ICD-10-CM | POA: Diagnosis not present

## 2013-06-27 DIAGNOSIS — H409 Unspecified glaucoma: Secondary | ICD-10-CM | POA: Diagnosis not present

## 2013-06-27 DIAGNOSIS — Z961 Presence of intraocular lens: Secondary | ICD-10-CM | POA: Diagnosis not present

## 2013-06-27 DIAGNOSIS — R7309 Other abnormal glucose: Secondary | ICD-10-CM | POA: Diagnosis not present

## 2013-06-27 DIAGNOSIS — E119 Type 2 diabetes mellitus without complications: Secondary | ICD-10-CM | POA: Diagnosis not present

## 2013-07-06 DIAGNOSIS — N949 Unspecified condition associated with female genital organs and menstrual cycle: Secondary | ICD-10-CM | POA: Diagnosis not present

## 2013-07-06 DIAGNOSIS — Z01419 Encounter for gynecological examination (general) (routine) without abnormal findings: Secondary | ICD-10-CM | POA: Diagnosis not present

## 2013-07-14 DIAGNOSIS — N949 Unspecified condition associated with female genital organs and menstrual cycle: Secondary | ICD-10-CM | POA: Diagnosis not present

## 2013-08-09 DIAGNOSIS — M545 Low back pain, unspecified: Secondary | ICD-10-CM | POA: Diagnosis not present

## 2013-08-09 DIAGNOSIS — M25569 Pain in unspecified knee: Secondary | ICD-10-CM | POA: Diagnosis not present

## 2013-08-09 DIAGNOSIS — M722 Plantar fascial fibromatosis: Secondary | ICD-10-CM | POA: Diagnosis not present

## 2013-08-18 DIAGNOSIS — M545 Low back pain, unspecified: Secondary | ICD-10-CM | POA: Diagnosis not present

## 2013-08-23 DIAGNOSIS — M545 Low back pain, unspecified: Secondary | ICD-10-CM | POA: Diagnosis not present

## 2013-09-03 ENCOUNTER — Emergency Department (INDEPENDENT_AMBULATORY_CARE_PROVIDER_SITE_OTHER): Payer: Medicare Other

## 2013-09-03 ENCOUNTER — Emergency Department (INDEPENDENT_AMBULATORY_CARE_PROVIDER_SITE_OTHER)
Admission: EM | Admit: 2013-09-03 | Discharge: 2013-09-03 | Disposition: A | Discharge status: Home / Self Care | Payer: Medicare Other | Source: Home / Self Care | Attending: Family Medicine | Admitting: Family Medicine

## 2013-09-03 ENCOUNTER — Encounter (HOSPITAL_COMMUNITY): Payer: Self-pay | Admitting: Emergency Medicine

## 2013-09-03 DIAGNOSIS — S59909A Unspecified injury of unspecified elbow, initial encounter: Secondary | ICD-10-CM | POA: Diagnosis not present

## 2013-09-03 DIAGNOSIS — S20229A Contusion of unspecified back wall of thorax, initial encounter: Secondary | ICD-10-CM | POA: Diagnosis not present

## 2013-09-03 DIAGNOSIS — W19XXXA Unspecified fall, initial encounter: Secondary | ICD-10-CM

## 2013-09-03 DIAGNOSIS — Y93H2 Activity, gardening and landscaping: Secondary | ICD-10-CM

## 2013-09-03 DIAGNOSIS — M25529 Pain in unspecified elbow: Secondary | ICD-10-CM | POA: Diagnosis not present

## 2013-09-03 DIAGNOSIS — Y92009 Unspecified place in unspecified non-institutional (private) residence as the place of occurrence of the external cause: Secondary | ICD-10-CM | POA: Diagnosis not present

## 2013-09-03 DIAGNOSIS — S20221A Contusion of right back wall of thorax, initial encounter: Secondary | ICD-10-CM

## 2013-09-03 DIAGNOSIS — S298XXA Other specified injuries of thorax, initial encounter: Secondary | ICD-10-CM | POA: Diagnosis not present

## 2013-09-03 DIAGNOSIS — S59919A Unspecified injury of unspecified forearm, initial encounter: Secondary | ICD-10-CM | POA: Diagnosis not present

## 2013-09-03 MED ORDER — TRAMADOL HCL 50 MG PO TABS: 50.0000 mg | ORAL_TABLET | Freq: Four times a day (QID) | ORAL | Status: DC | PRN

## 2013-09-03 NOTE — ED Notes (Signed)
Pt  Fell  sev  Days  Ago     And  Injured  Her  r  Side      And  l  Elbow      She  Reports         It  Hurts  When  She  Takes  A  Deep breath          She      Is  Sitting upright on  Exam table  Speaking  In complete  sentances       And  Is  Is  No acute  Distress

## 2013-09-03 NOTE — Discharge Instructions (Signed)
Heat and pain medicine as needed, return or see your doctor if further problems.

## 2013-09-03 NOTE — ED Provider Notes (Signed)
CSN: [id number]     Arrival date & time 09/03/13  1138 History   First MD Initiated Contact with Patient 09/03/13 1217     Chief Complaint  Patient presents with  . Fall   (Consider location/radiation/quality/duration/timing/severity/associated sxs/prior Treatment) Patient is a 73 y.o. female presenting with fall. The history is provided by the patient.  Fall This is a new problem. The current episode started 2 days ago (fell back pulling branches in yard.on thurs.). The problem has been gradually worsening. Associated symptoms include chest pain. Pertinent negatives include no abdominal pain and no headaches.    Past Medical History  Diagnosis Date  . Hypertension   . Thyroid disease   . Liver cyst   . Asthma   . Heart murmur   . Kidney cysts   . Kidney stones   . Hypothyroidism   . Cancer     stomach  . [place] basal cell carcinoma   . Arthritis   . Osteoporosis   . Gout   . Dyslipidemia   . Rhinitis   . Anxiety   . Insomnia   . Chronic back pain   . Depression   . Headache(784.0)     migraines in past  . Vitamin D deficiency    Past Surgical History  Procedure Laterality Date  . Abdominal hysterectomy    . Glaucoma surgery    . Cataract extraction    . Knee surgery    . Tonsillectomy    . Stomach surgery      [facility]  . Tonsillectomy    . Esophagogastroduodenoscopy (egd) with propofol N/A 07/05/2012    Procedure: ESOPHAGOGASTRODUODENOSCOPY (EGD) WITH PROPOFOL;  Surgeon: Garlan Fair, MD;  Location: [facility] END[facility]COPY;  Service: Endoscopy;  Laterality: N/A;  . Colonoscopy with propofol N/A 07/05/2012    Procedure: COLONOSCOPY WITH PROPOFOL;  Surgeon: Garlan Fair, MD;  Location: [facility];  Service: Endoscopy;  Laterality: N/A;   Family History  Problem Relation Age of Onset  . Diabetes Mother    History  Substance Use Topics  . Smoking status: Former Smoker -- 1.50 packs/day for 22 years    Types: Cigarettes  . Smokeless tobacco: Never Used  . Alcohol  Use: No   OB History   Grav Para Term Preterm Abortions TAB SAB Ect Mult Living                 Review of Systems  Constitutional: Negative.   Cardiovascular: Positive for chest pain. Negative for palpitations and leg swelling.  Gastrointestinal: Negative.  Negative for abdominal pain.  Musculoskeletal: Positive for back pain and joint swelling.  Skin: Negative.   Neurological: Negative for dizziness and headaches.    Allergies  Aspirin; Contrast media; Honey; Menthol (topical analgesic); Morphine and related; and Iohexol  Home Medications   Prior to Admission medications   Medication Sig Start Date End Date Taking? Authorizing Provider  albuterol (PROVENTIL HFA;VENTOLIN HFA) 108 (90 BASE) MCG/ACT inhaler Inhale 1 puff into the lungs every 6 (six) hours as needed. For shortness of breath    Historical Provider, MD  brinzolamide (AZOPT) 1 % ophthalmic suspension Place 1 drop into the left eye 2 (two) times daily.    Historical Provider, MD  cholecalciferol (VITAMIN D) 1000 UNITS tablet Take 1,000 Units by mouth daily.    Historical Provider, MD  clopidogrel (PLAVIX) 75 MG tablet Take 75 mg by mouth every other day.    Historical Provider, MD  colchicine 0.6 MG tablet Take 0.6 mg  by mouth daily as needed (for gout).     Historical Provider, MD  Cyanocobalamin (VITAMIN B-12 IJ) Inject 1,000 mcg as directed every 21 ( twenty-one) days.    Historical Provider, MD  diltiazem (TIAZAC) 240 MG 24 hr capsule Take 240 mg by mouth daily.    Historical Provider, MD  Fluticasone-Salmeterol (ADVAIR) 250-50 MCG/DOSE AEPB Inhale 1 puff into the lungs every 12 (twelve) hours as needed (for shortness of breath).    Historical Provider, MD  irbesartan-hydrochlorothiazide (AVALIDE) 150-12.5 MG per tablet Take 1 tablet by mouth daily. 06/03/12   Wenda Low, MD  levothyroxine (SYNTHROID, LEVOTHROID) 88 MCG tablet Take 88 mcg by mouth every morning.     Historical Provider, MD  Multiple Vitamin  (MULTIVITAMIN) tablet Take 1 tablet by mouth daily.    Historical Provider, MD  ondansetron (ZOFRAN) 4 MG tablet Take 4 mg by mouth every 8 (eight) hours as needed for nausea.     Historical Provider, MD  oxyCODONE (ROXICODONE) 5 MG immediate release tablet Take 1 tablet (5 mg total) by mouth every 8 (eight) hours as needed for pain. 06/03/12   Wenda Low, MD  simvastatin (ZOCOR) 20 MG tablet Take 20 mg by mouth every evening.    Historical Provider, MD  traMADol (ULTRAM) 50 MG tablet Take 50 mg by mouth daily as needed. For gout pain    Historical Provider, MD  zolpidem (AMBIEN) 10 MG tablet Take 5 mg by mouth at bedtime as needed for sleep.    Historical Provider, MD   BP 161/85  Pulse 77  Temp(Src) 97.9 F (36.6 C)  Resp 20  SpO2 100% Physical Exam  Nursing note and vitals reviewed. Constitutional: She is oriented to person, place, and time. She appears well-developed and well-nourished.  Pulmonary/Chest: Effort normal.   She exhibits tenderness.  Musculoskeletal: She exhibits tenderness.       Left elbow: She exhibits swelling. She exhibits normal range of motion, no effusion and no deformity. Tenderness found. Lateral epicondyle and olecranon process tenderness noted.  Neurological: She is alert and oriented to person, place, and time.  Skin: Skin is warm and dry.    ED Course  Procedures (including critical care time) Labs Review Labs Reviewed - No data to display  Imaging Review Dg Ribs Unilateral W/chest Right  09/03/2013   CLINICAL DATA:  Fall.  EXAM: RIGHT RIBS AND CHEST - 3+ VIEW  COMPARISON:  None.  FINDINGS: Mediastinum and hilar structures are normal. Mild left basilar atelectasis versus scarring . No pleural effusion or pneumothorax. Stable mild cardiomegaly. Normal pulmonary vascularity.  No acute bony abnormality.  No evidence of displaced rib fracture.  IMPRESSION: 1. Mild left basilar atelectasis versus scarring. 2. No evidence of rib fracture.  No pneumothorax.    Electronically Signed   By: Marcello Moores  Register   On: 09/03/2013 13:09   Dg Elbow Complete Left  09/03/2013   CLINICAL DATA:  Lateral elbow pain.  Fell 2 days ago.  EXAM: LEFT ELBOW - COMPLETE 3+ VIEW  COMPARISON:  None.  FINDINGS: No fracture. The elbow joint is normally spaced and aligned. No joint effusion. Soft tissues are unremarkable.  IMPRESSION: Negative.   Electronically Signed   By: Lajean Manes [name]   On: 09/03/2013 13:14   X-rays reviewed and report per radiologist.    MDM   1. Contusion of right back wall of thorax        Billy Fischer, MD 09/03/13 1332

## 2013-09-14 DIAGNOSIS — S20219A Contusion of unspecified front wall of thorax, initial encounter: Secondary | ICD-10-CM | POA: Diagnosis not present

## 2013-09-14 DIAGNOSIS — I10 Essential (primary) hypertension: Secondary | ICD-10-CM | POA: Diagnosis not present

## 2013-09-14 DIAGNOSIS — R079 Chest pain, unspecified: Secondary | ICD-10-CM | POA: Diagnosis not present

## 2013-09-14 DIAGNOSIS — E1129 Type 2 diabetes mellitus with other diabetic kidney complication: Secondary | ICD-10-CM | POA: Diagnosis not present

## 2013-09-14 DIAGNOSIS — E039 Hypothyroidism, unspecified: Secondary | ICD-10-CM | POA: Diagnosis not present

## 2013-09-28 DIAGNOSIS — B029 Zoster without complications: Secondary | ICD-10-CM | POA: Diagnosis not present

## 2013-09-28 DIAGNOSIS — M5137 Other intervertebral disc degeneration, lumbosacral region: Secondary | ICD-10-CM | POA: Diagnosis not present

## 2013-10-26 DIAGNOSIS — B0229 Other postherpetic nervous system involvement: Secondary | ICD-10-CM | POA: Diagnosis not present

## 2013-11-09 DIAGNOSIS — M5137 Other intervertebral disc degeneration, lumbosacral region: Secondary | ICD-10-CM | POA: Diagnosis not present

## 2013-11-09 DIAGNOSIS — M47814 Spondylosis without myelopathy or radiculopathy, thoracic region: Secondary | ICD-10-CM | POA: Diagnosis not present

## 2013-11-09 DIAGNOSIS — S335XXA Sprain of ligaments of lumbar spine, initial encounter: Secondary | ICD-10-CM | POA: Diagnosis not present

## 2013-11-09 DIAGNOSIS — M999 Biomechanical lesion, unspecified: Secondary | ICD-10-CM | POA: Diagnosis not present

## 2013-11-10 DIAGNOSIS — M5137 Other intervertebral disc degeneration, lumbosacral region: Secondary | ICD-10-CM | POA: Diagnosis not present

## 2013-11-10 DIAGNOSIS — M999 Biomechanical lesion, unspecified: Secondary | ICD-10-CM | POA: Diagnosis not present

## 2013-11-10 DIAGNOSIS — S335XXA Sprain of ligaments of lumbar spine, initial encounter: Secondary | ICD-10-CM | POA: Diagnosis not present

## 2013-11-10 DIAGNOSIS — M47814 Spondylosis without myelopathy or radiculopathy, thoracic region: Secondary | ICD-10-CM | POA: Diagnosis not present

## 2013-11-14 DIAGNOSIS — M999 Biomechanical lesion, unspecified: Secondary | ICD-10-CM | POA: Diagnosis not present

## 2013-11-14 DIAGNOSIS — S335XXA Sprain of ligaments of lumbar spine, initial encounter: Secondary | ICD-10-CM | POA: Diagnosis not present

## 2013-11-14 DIAGNOSIS — M47814 Spondylosis without myelopathy or radiculopathy, thoracic region: Secondary | ICD-10-CM | POA: Diagnosis not present

## 2013-11-14 DIAGNOSIS — M5137 Other intervertebral disc degeneration, lumbosacral region: Secondary | ICD-10-CM | POA: Diagnosis not present

## 2013-12-01 ENCOUNTER — Encounter (HOSPITAL_COMMUNITY): Payer: Self-pay | Admitting: Emergency Medicine

## 2013-12-01 ENCOUNTER — Emergency Department (INDEPENDENT_AMBULATORY_CARE_PROVIDER_SITE_OTHER)
Admission: EM | Admit: 2013-12-01 | Discharge: 2013-12-01 | Disposition: A | Discharge status: Home / Self Care | Payer: Medicare Other | Source: Home / Self Care | Attending: Family Medicine | Admitting: Family Medicine

## 2013-12-01 DIAGNOSIS — R21 Rash and other nonspecific skin eruption: Secondary | ICD-10-CM

## 2013-12-01 DIAGNOSIS — J45901 Unspecified asthma with (acute) exacerbation: Secondary | ICD-10-CM

## 2013-12-01 MED ORDER — TRAMADOL HCL 50 MG PO TABS: 50.0000 mg | ORAL_TABLET | Freq: Every evening | ORAL | Status: DC | PRN

## 2013-12-01 MED ORDER — IPRATROPIUM-ALBUTEROL 0.5-2.5 (3) MG/3ML IN SOLN
3.0000 mL | Freq: Once | RESPIRATORY_TRACT | Status: AC
  Administered 2013-12-01: 3 mL via RESPIRATORY_TRACT

## 2013-12-01 MED ORDER — PREDNISONE 10 MG PO TABS: 30.0000 mg | ORAL_TABLET | Freq: Every day | ORAL | Status: DC

## 2013-12-01 MED ORDER — TRIAMCINOLONE ACETONIDE 0.1 % EX CREA: 1.0000 "application " | TOPICAL_CREAM | Freq: Two times a day (BID) | CUTANEOUS | Status: DC

## 2013-12-01 MED ORDER — IPRATROPIUM-ALBUTEROL 0.5-2.5 (3) MG/3ML IN SOLN
RESPIRATORY_TRACT | Status: AC
  Filled 2013-12-01: qty 3

## 2013-12-01 NOTE — Discharge Instructions (Signed)
Thank you for coming in today. STOP the over the counter cough medicine.  Start the prednisone.  Use tramadol for cough as needed Continue albuterol as needed Use cream for rash as needed Followup with your primary care Dr. Call or go to the emergency room if you get worse, have trouble breathing, have chest pains, or palpitations.    Asthma, Acute Bronchospasm Acute bronchospasm caused by asthma is also referred to as an asthma attack. Bronchospasm means your air passages become narrowed. The narrowing is caused by inflammation and tightening of the muscles in the air tubes (bronchi) in your lungs. This can make it hard to breathe or cause you to wheeze and cough. CAUSES Possible triggers are:  Animal dander from the skin, hair, or feathers of animals.  Dust mites contained in house dust.  Cockroaches.  Pollen from trees or grass.  Mold.  Cigarette or tobacco smoke.  Air pollutants such as dust, household cleaners, hair sprays, aerosol sprays, paint fumes, strong chemicals, or strong odors.  Cold air or weather changes. Cold air may trigger inflammation. Winds increase molds and pollens in the air.  Strong emotions such as crying or laughing hard.  Stress.  Certain medicines such as aspirin or beta-blockers.  Sulfites in foods and drinks, such as dried fruits and wine.  Infections or inflammatory conditions, such as a flu, cold, or inflammation of the nasal membranes (rhinitis).  Gastroesophageal reflux disease (GERD). GERD is a condition where stomach acid backs up into your esophagus.  Exercise or strenuous activity. SIGNS AND SYMPTOMS   Wheezing.  Excessive coughing, particularly at night.  Chest tightness.  Shortness of breath. DIAGNOSIS  Your health care provider will ask you about your medical history and perform a physical exam. A chest X-ray or blood testing may be performed to look for other causes of your symptoms or other conditions that may have  triggered your asthma attack. TREATMENT  Treatment is aimed at reducing inflammation and opening up the airways in your lungs. Most asthma attacks are treated with inhaled medicines. These include quick relief or rescue medicines (such as bronchodilators) and controller medicines (such as inhaled corticosteroids). These medicines are sometimes given through an inhaler or a nebulizer. Systemic steroid medicine taken by mouth or given through an IV tube also can be used to reduce the inflammation when an attack is moderate or severe. Antibiotic medicines are only used if a bacterial infection is present.  HOME CARE INSTRUCTIONS   Rest.  Drink plenty of liquids. This helps the mucus to remain thin and be easily coughed up. Only use caffeine in moderation and do not use alcohol until you have recovered from your illness.  Do not smoke. Avoid being exposed to secondhand smoke.  You play a critical role in keeping yourself in good health. Avoid exposure to things that cause you to wheeze or to have breathing problems.  Keep your medicines up-to-date and available. Carefully follow your health care provider's treatment plan.  Take your medicine exactly as prescribed.  When pollen or pollution is bad, keep windows closed and use an air conditioner or go to places with air conditioning.  Asthma requires careful medical care. See your health care provider for a follow-up as advised. If you are more than [redacted] weeks pregnant and you were prescribed any new medicines, let your obstetrician know about the visit and how you are doing. Follow up with your health care provider as directed.  After you have recovered from your asthma attack,  make an appointment with your outpatient doctor to talk about ways to reduce the likelihood of future attacks. If you do not have a doctor who manages your asthma, make an appointment with a primary care doctor to discuss your asthma. SEEK IMMEDIATE MEDICAL CARE IF:   You  are getting worse.  You have trouble breathing. If severe, call your local emergency services (911 in the [place]  You develop chest pain or discomfort.  You are vomiting.  You are not able to keep fluids down.  You are coughing up yellow, green, brown, or bloody sputum.  You have a fever and your symptoms suddenly get worse.  You have trouble swallowing. MAKE SURE YOU:   Understand these instructions.  Will watch your condition.  Will get help right away if you are not doing well or get worse. Document Released: 08/06/2006 Document Revised: 04/26/2013 Document Reviewed: 10/27/2012 [facility] Patient Information 2015 [place], [place]. This information is not intended to replace advice given to you by your health care provider. Make sure you discuss any questions you have with your health care provider.  Contact Dermatitis Contact dermatitis is a reaction to certain substances that touch the skin. Contact dermatitis can be either irritant contact dermatitis or allergic contact dermatitis. Irritant contact dermatitis does not require previous exposure to the substance for a reaction to occur.Allergic contact dermatitis only occurs if you have been exposed to the substance before. Upon a repeat exposure, your body reacts to the substance.  CAUSES  Many substances can cause contact dermatitis. Irritant dermatitis is most commonly caused by repeated exposure to mildly irritating substances, such as:  Makeup.  Soaps.  Detergents.  Bleaches.  Acids.  Metal salts, such as nickel. Allergic contact dermatitis is most commonly caused by exposure to:  Poisonous plants.  Chemicals (deodorants, shampoos).  Jewelry.  Latex.  Neomycin in triple antibiotic cream.  Preservatives in products, including clothing. SYMPTOMS  The area of skin that is exposed may develop:  Dryness or flaking.  Redness.  Cracks.  Itching.  Pain or a burning sensation.  Blisters. With allergic  contact dermatitis, there may also be swelling in areas such as the eyelids, mouth, or genitals.  DIAGNOSIS  Your caregiver can usually tell what the problem is by doing a physical exam. In cases where the cause is uncertain and an allergic contact dermatitis is suspected, a patch skin test may be performed to help determine the cause of your dermatitis. TREATMENT Treatment includes protecting the skin from further contact with the irritating substance by avoiding that substance if possible. Barrier creams, powders, and gloves may be helpful. Your caregiver may also recommend:  Steroid creams or ointments applied 2 times daily. For best results, soak the rash area in cool water for 20 minutes. Then apply the medicine. Cover the area with a plastic wrap. You can store the steroid cream in the refrigerator for a "chilly" effect on your rash. That may decrease itching. Oral steroid medicines may be needed in more severe cases.  Antibiotics or antibacterial ointments if a skin infection is present.  Antihistamine lotion or an antihistamine taken by mouth to ease itching.  Lubricants to keep moisture in your skin.  Burow's solution to reduce redness and soreness or to dry a weeping rash. Mix one packet or tablet of solution in 2 cups cool water. Dip a clean washcloth in the mixture, wring it out a bit, and put it on the affected area. Leave the cloth in place for 30 minutes.  Do this as often as possible throughout the day.  Taking several cornstarch or baking soda baths daily if the area is too large to cover with a washcloth. Harsh chemicals, such as alkalis or acids, can cause skin damage that is like a burn. You should flush your skin for 15 to 20 minutes with cold water after such an exposure. You should also seek immediate medical care after exposure. Bandages (dressings), antibiotics, and pain medicine may be needed for severely irritated skin.  HOME CARE INSTRUCTIONS  Avoid the substance that  caused your reaction.  Keep the area of skin that is affected away from hot water, soap, sunlight, chemicals, acidic substances, or anything else that would irritate your skin.  Do not scratch the rash. Scratching may cause the rash to become infected.  You may take cool baths to help stop the itching.  Only take over-the-counter or prescription medicines as directed by your caregiver.  See your caregiver for follow-up care as directed to make sure your skin is healing properly. SEEK MEDICAL CARE IF:   Your condition is not better after 3 days of treatment.  You seem to be getting worse.  You see signs of infection such as swelling, tenderness, redness, soreness, or warmth in the affected area.  You have any problems related to your medicines. Document Released: 04/18/2000 Document Revised: 07/14/2011 Document Reviewed: 09/24/2010 [facility] Patient Information 2015 [place], [place]. This information is not intended to replace advice given to you by your health care provider. Make sure you discuss any questions you have with your health care provider.

## 2013-12-01 NOTE — ED Provider Notes (Signed)
Kellie Ward is a 73 y.o. female who presents to Urgent Care today for cough. Patient said cough and wheezing and mild shortness of breath for the past 3 days. Symptoms are consistent with previous episodes of asthma exacerbation. She's tried her albuterol which helps temporarily. Additionally she notes a rash present for a few days. The rash is mildly itchy and is spreading across her trunk and lower extremities. The rash started after she started taking some over-the-counter NyQuil-type cough medications. She denies any new soaps or detergents. She feels well otherwise. No chest pains nausea vomiting or diarrhea.   Past Medical History  Diagnosis Date  . Hypertension   . Thyroid disease   . Liver cyst   . Asthma   . Heart murmur   . Kidney cysts   . Kidney stones   . Hypothyroidism   . Cancer     stomach  . [place]phea basal cell carcinoma   . Arthritis   . Osteoporosis   . Gout   . Dyslipidemia   . Rhinitis   . Anxiety   . Insomnia   . Chronic back pain   . Depression   . Headache(784.0)     migraines in past  . Vitamin D deficiency    History  Substance Use Topics  . Smoking status: Former Smoker -- 1.50 packs/day for 22 years    Types: Cigarettes  . Smokeless tobacco: Never Used  . Alcohol Use: No   ROS as above Medications: No current facility-administered medications for this encounter.   Current Outpatient Prescriptions  Medication Sig Dispense Refill  . clopidogrel (PLAVIX) 75 MG tablet Take 75 mg by mouth every other day.      . colchicine 0.6 MG tablet Take 0.6 mg by mouth daily as needed (for gout).       . Cyanocobalamin (VITAMIN B-12 IJ) Inject 1,000 mcg as directed every 21 ( twenty-one) days.      Marland Kitchen levothyroxine (SYNTHROID, LEVOTHROID) 88 MCG tablet Take 88 mcg by mouth every morning.       . simvastatin (ZOCOR) 20 MG tablet Take 20 mg by mouth every evening.      Marland Kitchen albuterol (PROVENTIL HFA;VENTOLIN HFA) 108 (90 BASE) MCG/ACT inhaler Inhale 1 puff into the  lungs every 6 (six) hours as needed. For shortness of breath      . brinzolamide (AZOPT) 1 % ophthalmic suspension Place 1 drop into the left eye 2 (two) times daily.      . cholecalciferol (VITAMIN D) 1000 UNITS tablet Take 1,000 Units by mouth daily.      Marland Kitchen diltiazem (TIAZAC) 240 MG 24 hr capsule Take 240 mg by mouth daily.      . Fluticasone-Salmeterol (ADVAIR) 250-50 MCG/DOSE AEPB Inhale 1 puff into the lungs every 12 (twelve) hours as needed (for shortness of breath).      . irbesartan-hydrochlorothiazide (AVALIDE) 150-12.5 MG per tablet Take 1 tablet by mouth daily.  30 tablet  3  . Multiple Vitamin (MULTIVITAMIN) tablet Take 1 tablet by mouth daily.      . ondansetron (ZOFRAN) 4 MG tablet Take 4 mg by mouth every 8 (eight) hours as needed for nausea.       Marland Kitchen oxyCODONE (ROXICODONE) 5 MG immediate release tablet Take 1 tablet (5 mg total) by mouth every 8 (eight) hours as needed for pain.  30 tablet  0  . predniSONE (DELTASONE) 10 MG tablet Take 3 tablets (30 mg total) by mouth daily.  15 tablet  0  .  traMADol (ULTRAM) 50 MG tablet Take 1 tablet (50 mg total) by mouth at bedtime as needed (cough).  10 tablet  0  . triamcinolone cream (KENALOG) 0.1 % Apply 1 application topically 2 (two) times daily.  30 g  1  . zolpidem (AMBIEN) 10 MG tablet Take 5 mg by mouth at bedtime as needed for sleep.        Exam:  BP 134/72  Pulse 85  Temp(Src) 98.1 F (36.7 C) (Oral)  Resp 16  SpO2 100% Gen: Well NAD HEENT: EOMI,  MMM no mucocutaneous lesions present. Posterior pharynx is normal as are tympanic membranes bilaterally. Lungs: Normal work of breathing. Wheezing present bilaterally Heart: RRR no MRG Abd: NABS, Soft. Nondistended, Nontender Exts: Brisk capillary refill, warm and well perfused.  Skin: mildly erythematous macular rash on right thigh and back. Nontender. Blanchable.  Patient was given a DuoNeb nebulizer treatment and felt much better  No results found for this or any previous  visit (from the past 24 hour(s)). No results found.  Assessment and Plan: 73 y.o. female with  1) asthma exacerbation: prednisone and albuterol. Followup with primary care Dr. Tramadol for cough 2) rash: unclear etiology. Contact dermatitis versus drug rash. Recommend discontinuation of NyQuil type medication. Tramadol for cough.  Discussed warning signs or symptoms. Please see discharge instructions. Patient expresses understanding.   This note was created using Systems analyst. Any transcription errors are unintended.    Gregor Hams, MD 12/01/13 [phone]

## 2013-12-01 NOTE — ED Notes (Signed)
C/o  Cough. Since yesterday.   Pt is taking otc cough meds and using halls with no relief.   Woke today with rash on arms, legs, and abdomen.  Denies any changes in soaps or detergents.  No new rx medications.

## 2014-01-04 DIAGNOSIS — H4040X0 Glaucoma secondary to eye inflammation, unspecified eye, stage unspecified: Secondary | ICD-10-CM | POA: Diagnosis not present

## 2014-01-04 DIAGNOSIS — H409 Unspecified glaucoma: Secondary | ICD-10-CM | POA: Diagnosis not present

## 2014-01-06 DIAGNOSIS — J45909 Unspecified asthma, uncomplicated: Secondary | ICD-10-CM | POA: Diagnosis not present

## 2014-01-06 DIAGNOSIS — F411 Generalized anxiety disorder: Secondary | ICD-10-CM | POA: Diagnosis not present

## 2014-01-06 DIAGNOSIS — D510 Vitamin B12 deficiency anemia due to intrinsic factor deficiency: Secondary | ICD-10-CM | POA: Diagnosis not present

## 2014-01-06 DIAGNOSIS — E039 Hypothyroidism, unspecified: Secondary | ICD-10-CM | POA: Diagnosis not present

## 2014-01-06 DIAGNOSIS — E782 Mixed hyperlipidemia: Secondary | ICD-10-CM | POA: Diagnosis not present

## 2014-01-06 DIAGNOSIS — I10 Essential (primary) hypertension: Secondary | ICD-10-CM | POA: Diagnosis not present

## 2014-01-06 DIAGNOSIS — K219 Gastro-esophageal reflux disease without esophagitis: Secondary | ICD-10-CM | POA: Diagnosis not present

## 2014-01-06 DIAGNOSIS — Z23 Encounter for immunization: Secondary | ICD-10-CM | POA: Diagnosis not present

## 2014-01-06 DIAGNOSIS — E1129 Type 2 diabetes mellitus with other diabetic kidney complication: Secondary | ICD-10-CM | POA: Diagnosis not present

## 2014-01-06 DIAGNOSIS — N182 Chronic kidney disease, stage 2 (mild): Secondary | ICD-10-CM | POA: Diagnosis not present

## 2014-01-27 DIAGNOSIS — D510 Vitamin B12 deficiency anemia due to intrinsic factor deficiency: Secondary | ICD-10-CM | POA: Diagnosis not present

## 2014-02-06 DIAGNOSIS — H4041X2 Glaucoma secondary to eye inflammation, right eye, moderate stage: Secondary | ICD-10-CM | POA: Diagnosis not present

## 2014-02-06 DIAGNOSIS — H4042X3 Glaucoma secondary to eye inflammation, left eye, severe stage: Secondary | ICD-10-CM | POA: Diagnosis not present

## 2014-02-21 DIAGNOSIS — D510 Vitamin B12 deficiency anemia due to intrinsic factor deficiency: Secondary | ICD-10-CM | POA: Diagnosis not present

## 2014-02-21 DIAGNOSIS — E039 Hypothyroidism, unspecified: Secondary | ICD-10-CM | POA: Diagnosis not present

## 2014-03-23 DIAGNOSIS — D510 Vitamin B12 deficiency anemia due to intrinsic factor deficiency: Secondary | ICD-10-CM | POA: Diagnosis not present

## 2014-04-06 DIAGNOSIS — M25561 Pain in right knee: Secondary | ICD-10-CM | POA: Diagnosis not present

## 2014-04-13 DIAGNOSIS — D510 Vitamin B12 deficiency anemia due to intrinsic factor deficiency: Secondary | ICD-10-CM | POA: Diagnosis not present

## 2014-04-22 DIAGNOSIS — M1711 Unilateral primary osteoarthritis, right knee: Secondary | ICD-10-CM | POA: Diagnosis not present

## 2014-05-17 DIAGNOSIS — S8981XD Other specified injuries of right lower leg, subsequent encounter: Secondary | ICD-10-CM | POA: Diagnosis not present

## 2014-05-17 DIAGNOSIS — M23206 Derangement of unspecified meniscus due to old tear or injury, right knee: Secondary | ICD-10-CM | POA: Diagnosis not present

## 2014-05-24 DIAGNOSIS — F324 Major depressive disorder, single episode, in partial remission: Secondary | ICD-10-CM | POA: Diagnosis not present

## 2014-05-24 DIAGNOSIS — N182 Chronic kidney disease, stage 2 (mild): Secondary | ICD-10-CM | POA: Diagnosis not present

## 2014-05-24 DIAGNOSIS — Z23 Encounter for immunization: Secondary | ICD-10-CM | POA: Diagnosis not present

## 2014-05-24 DIAGNOSIS — D3A00 Benign carcinoid tumor of unspecified site: Secondary | ICD-10-CM | POA: Diagnosis not present

## 2014-05-24 DIAGNOSIS — Z1389 Encounter for screening for other disorder: Secondary | ICD-10-CM | POA: Diagnosis not present

## 2014-05-24 DIAGNOSIS — E039 Hypothyroidism, unspecified: Secondary | ICD-10-CM | POA: Diagnosis not present

## 2014-05-24 DIAGNOSIS — E119 Type 2 diabetes mellitus without complications: Secondary | ICD-10-CM | POA: Diagnosis not present

## 2014-05-24 DIAGNOSIS — E780 Pure hypercholesterolemia: Secondary | ICD-10-CM | POA: Diagnosis not present

## 2014-05-24 DIAGNOSIS — I10 Essential (primary) hypertension: Secondary | ICD-10-CM | POA: Diagnosis not present

## 2014-05-26 DIAGNOSIS — S83281A Other tear of lateral meniscus, current injury, right knee, initial encounter: Secondary | ICD-10-CM | POA: Diagnosis not present

## 2014-05-26 DIAGNOSIS — S83241A Other tear of medial meniscus, current injury, right knee, initial encounter: Secondary | ICD-10-CM | POA: Diagnosis not present

## 2014-05-29 DIAGNOSIS — M94261 Chondromalacia, right knee: Secondary | ICD-10-CM | POA: Diagnosis not present

## 2014-05-29 DIAGNOSIS — S83231A Complex tear of medial meniscus, current injury, right knee, initial encounter: Secondary | ICD-10-CM | POA: Diagnosis not present

## 2014-05-29 DIAGNOSIS — X58XXXA Exposure to other specified factors, initial encounter: Secondary | ICD-10-CM | POA: Diagnosis not present

## 2014-05-29 DIAGNOSIS — S83271A Complex tear of lateral meniscus, current injury, right knee, initial encounter: Secondary | ICD-10-CM | POA: Diagnosis not present

## 2014-05-29 DIAGNOSIS — Y929 Unspecified place or not applicable: Secondary | ICD-10-CM | POA: Diagnosis not present

## 2014-06-08 DIAGNOSIS — Z9889 Other specified postprocedural states: Secondary | ICD-10-CM | POA: Diagnosis not present

## 2014-06-15 DIAGNOSIS — M25561 Pain in right knee: Secondary | ICD-10-CM | POA: Diagnosis not present

## 2014-06-15 DIAGNOSIS — M25661 Stiffness of right knee, not elsewhere classified: Secondary | ICD-10-CM | POA: Diagnosis not present

## 2014-06-21 DIAGNOSIS — M25661 Stiffness of right knee, not elsewhere classified: Secondary | ICD-10-CM | POA: Diagnosis not present

## 2014-06-21 DIAGNOSIS — M25561 Pain in right knee: Secondary | ICD-10-CM | POA: Diagnosis not present

## 2014-06-29 DIAGNOSIS — M25561 Pain in right knee: Secondary | ICD-10-CM | POA: Diagnosis not present

## 2014-06-30 DIAGNOSIS — M25561 Pain in right knee: Secondary | ICD-10-CM | POA: Diagnosis not present

## 2014-06-30 DIAGNOSIS — M25661 Stiffness of right knee, not elsewhere classified: Secondary | ICD-10-CM | POA: Diagnosis not present

## 2014-07-05 DIAGNOSIS — M25561 Pain in right knee: Secondary | ICD-10-CM | POA: Diagnosis not present

## 2014-07-05 DIAGNOSIS — M25661 Stiffness of right knee, not elsewhere classified: Secondary | ICD-10-CM | POA: Diagnosis not present

## 2014-07-07 DIAGNOSIS — M25561 Pain in right knee: Secondary | ICD-10-CM | POA: Diagnosis not present

## 2014-07-07 DIAGNOSIS — M25661 Stiffness of right knee, not elsewhere classified: Secondary | ICD-10-CM | POA: Diagnosis not present

## 2014-07-12 DIAGNOSIS — M25661 Stiffness of right knee, not elsewhere classified: Secondary | ICD-10-CM | POA: Diagnosis not present

## 2014-07-12 DIAGNOSIS — M25561 Pain in right knee: Secondary | ICD-10-CM | POA: Diagnosis not present

## 2014-07-14 DIAGNOSIS — M25561 Pain in right knee: Secondary | ICD-10-CM | POA: Diagnosis not present

## 2014-07-14 DIAGNOSIS — M25661 Stiffness of right knee, not elsewhere classified: Secondary | ICD-10-CM | POA: Diagnosis not present

## 2014-07-20 DIAGNOSIS — Z9889 Other specified postprocedural states: Secondary | ICD-10-CM | POA: Diagnosis not present

## 2014-07-20 DIAGNOSIS — M23206 Derangement of unspecified meniscus due to old tear or injury, right knee: Secondary | ICD-10-CM | POA: Diagnosis not present

## 2014-07-21 DIAGNOSIS — M25661 Stiffness of right knee, not elsewhere classified: Secondary | ICD-10-CM | POA: Diagnosis not present

## 2014-07-21 DIAGNOSIS — M25561 Pain in right knee: Secondary | ICD-10-CM | POA: Diagnosis not present

## 2014-09-15 DIAGNOSIS — E538 Deficiency of other specified B group vitamins: Secondary | ICD-10-CM | POA: Diagnosis not present

## 2014-09-15 DIAGNOSIS — M109 Gout, unspecified: Secondary | ICD-10-CM | POA: Diagnosis not present

## 2014-09-15 DIAGNOSIS — M79671 Pain in right foot: Secondary | ICD-10-CM | POA: Diagnosis not present

## 2014-09-19 DIAGNOSIS — M1711 Unilateral primary osteoarthritis, right knee: Secondary | ICD-10-CM | POA: Diagnosis not present

## 2014-10-18 ENCOUNTER — Other Ambulatory Visit: Payer: Self-pay | Admitting: Internal Medicine

## 2014-10-18 DIAGNOSIS — D510 Vitamin B12 deficiency anemia due to intrinsic factor deficiency: Secondary | ICD-10-CM | POA: Diagnosis not present

## 2014-10-18 DIAGNOSIS — K219 Gastro-esophageal reflux disease without esophagitis: Secondary | ICD-10-CM | POA: Diagnosis not present

## 2014-10-18 DIAGNOSIS — M810 Age-related osteoporosis without current pathological fracture: Secondary | ICD-10-CM | POA: Diagnosis not present

## 2014-10-18 DIAGNOSIS — Z1231 Encounter for screening mammogram for malignant neoplasm of breast: Secondary | ICD-10-CM

## 2014-10-18 DIAGNOSIS — D3A00 Benign carcinoid tumor of unspecified site: Secondary | ICD-10-CM | POA: Diagnosis not present

## 2014-10-18 DIAGNOSIS — N182 Chronic kidney disease, stage 2 (mild): Secondary | ICD-10-CM | POA: Diagnosis not present

## 2014-10-18 DIAGNOSIS — F324 Major depressive disorder, single episode, in partial remission: Secondary | ICD-10-CM | POA: Diagnosis not present

## 2014-10-18 DIAGNOSIS — I10 Essential (primary) hypertension: Secondary | ICD-10-CM | POA: Diagnosis not present

## 2014-10-18 DIAGNOSIS — E119 Type 2 diabetes mellitus without complications: Secondary | ICD-10-CM | POA: Diagnosis not present

## 2014-10-18 DIAGNOSIS — Z6831 Body mass index (BMI) 31.0-31.9, adult: Secondary | ICD-10-CM | POA: Diagnosis not present

## 2014-10-18 DIAGNOSIS — E039 Hypothyroidism, unspecified: Secondary | ICD-10-CM | POA: Diagnosis not present

## 2014-10-18 DIAGNOSIS — E780 Pure hypercholesterolemia: Secondary | ICD-10-CM | POA: Diagnosis not present

## 2014-10-27 DIAGNOSIS — R350 Frequency of micturition: Secondary | ICD-10-CM | POA: Diagnosis not present

## 2014-11-10 DIAGNOSIS — S0502XA Injury of conjunctiva and corneal abrasion without foreign body, left eye, initial encounter: Secondary | ICD-10-CM | POA: Diagnosis not present

## 2014-11-10 DIAGNOSIS — Z9889 Other specified postprocedural states: Secondary | ICD-10-CM | POA: Diagnosis not present

## 2014-11-10 DIAGNOSIS — R700 Elevated erythrocyte sedimentation rate: Secondary | ICD-10-CM | POA: Diagnosis not present

## 2014-11-10 DIAGNOSIS — Z87891 Personal history of nicotine dependence: Secondary | ICD-10-CM | POA: Diagnosis not present

## 2014-11-10 DIAGNOSIS — E119 Type 2 diabetes mellitus without complications: Secondary | ICD-10-CM | POA: Diagnosis not present

## 2014-11-10 DIAGNOSIS — Z8669 Personal history of other diseases of the nervous system and sense organs: Secondary | ICD-10-CM | POA: Diagnosis not present

## 2014-11-10 DIAGNOSIS — I10 Essential (primary) hypertension: Secondary | ICD-10-CM | POA: Diagnosis not present

## 2014-11-10 DIAGNOSIS — G44039 Episodic paroxysmal hemicrania, not intractable: Secondary | ICD-10-CM | POA: Diagnosis not present

## 2014-11-10 DIAGNOSIS — Z961 Presence of intraocular lens: Secondary | ICD-10-CM | POA: Diagnosis not present

## 2014-11-16 ENCOUNTER — Ambulatory Visit
Admission: RE | Admit: 2014-11-16 | Discharge: 2014-11-16 | Disposition: A | Discharge status: Home / Self Care | Payer: Medicare Other | Source: Ambulatory Visit | Attending: Internal Medicine | Admitting: Internal Medicine

## 2014-11-16 DIAGNOSIS — Z1231 Encounter for screening mammogram for malignant neoplasm of breast: Secondary | ICD-10-CM

## 2014-11-20 DIAGNOSIS — M899 Disorder of bone, unspecified: Secondary | ICD-10-CM | POA: Diagnosis not present

## 2014-11-20 DIAGNOSIS — M8589 Other specified disorders of bone density and structure, multiple sites: Secondary | ICD-10-CM | POA: Diagnosis not present

## 2014-11-20 DIAGNOSIS — D510 Vitamin B12 deficiency anemia due to intrinsic factor deficiency: Secondary | ICD-10-CM | POA: Diagnosis not present

## 2014-11-20 DIAGNOSIS — G44039 Episodic paroxysmal hemicrania, not intractable: Secondary | ICD-10-CM | POA: Diagnosis not present

## 2014-11-20 DIAGNOSIS — S0502XD Injury of conjunctiva and corneal abrasion without foreign body, left eye, subsequent encounter: Secondary | ICD-10-CM | POA: Diagnosis not present

## 2014-12-02 DIAGNOSIS — N1330 Unspecified hydronephrosis: Secondary | ICD-10-CM | POA: Diagnosis not present

## 2014-12-02 DIAGNOSIS — R109 Unspecified abdominal pain: Secondary | ICD-10-CM | POA: Diagnosis not present

## 2014-12-02 DIAGNOSIS — R197 Diarrhea, unspecified: Secondary | ICD-10-CM | POA: Diagnosis not present

## 2014-12-02 DIAGNOSIS — R112 Nausea with vomiting, unspecified: Secondary | ICD-10-CM | POA: Diagnosis not present

## 2014-12-02 DIAGNOSIS — R160 Hepatomegaly, not elsewhere classified: Secondary | ICD-10-CM | POA: Diagnosis not present

## 2015-01-15 DIAGNOSIS — N390 Urinary tract infection, site not specified: Secondary | ICD-10-CM | POA: Diagnosis not present

## 2015-01-17 DIAGNOSIS — B9620 Unspecified Escherichia coli [E. coli] as the cause of diseases classified elsewhere: Secondary | ICD-10-CM | POA: Diagnosis not present

## 2015-01-18 DIAGNOSIS — N390 Urinary tract infection, site not specified: Secondary | ICD-10-CM | POA: Diagnosis not present

## 2015-01-24 DIAGNOSIS — N390 Urinary tract infection, site not specified: Secondary | ICD-10-CM | POA: Diagnosis not present

## 2015-01-30 DIAGNOSIS — N390 Urinary tract infection, site not specified: Secondary | ICD-10-CM | POA: Diagnosis not present

## 2015-03-15 DIAGNOSIS — D3A092 Benign carcinoid tumor of the stomach: Secondary | ICD-10-CM | POA: Diagnosis not present

## 2015-03-15 DIAGNOSIS — F419 Anxiety disorder, unspecified: Secondary | ICD-10-CM | POA: Diagnosis not present

## 2015-03-15 DIAGNOSIS — Z23 Encounter for immunization: Secondary | ICD-10-CM | POA: Diagnosis not present

## 2015-03-15 DIAGNOSIS — N182 Chronic kidney disease, stage 2 (mild): Secondary | ICD-10-CM | POA: Diagnosis not present

## 2015-03-15 DIAGNOSIS — Z0000 Encounter for general adult medical examination without abnormal findings: Secondary | ICD-10-CM | POA: Diagnosis not present

## 2015-03-15 DIAGNOSIS — Z1389 Encounter for screening for other disorder: Secondary | ICD-10-CM | POA: Diagnosis not present

## 2015-03-15 DIAGNOSIS — F324 Major depressive disorder, single episode, in partial remission: Secondary | ICD-10-CM | POA: Diagnosis not present

## 2015-03-15 DIAGNOSIS — E039 Hypothyroidism, unspecified: Secondary | ICD-10-CM | POA: Diagnosis not present

## 2015-03-15 DIAGNOSIS — J45909 Unspecified asthma, uncomplicated: Secondary | ICD-10-CM | POA: Diagnosis not present

## 2015-03-15 DIAGNOSIS — E119 Type 2 diabetes mellitus without complications: Secondary | ICD-10-CM | POA: Diagnosis not present

## 2015-03-15 DIAGNOSIS — D510 Vitamin B12 deficiency anemia due to intrinsic factor deficiency: Secondary | ICD-10-CM | POA: Diagnosis not present

## 2015-03-15 DIAGNOSIS — G43909 Migraine, unspecified, not intractable, without status migrainosus: Secondary | ICD-10-CM | POA: Diagnosis not present

## 2015-03-15 DIAGNOSIS — I10 Essential (primary) hypertension: Secondary | ICD-10-CM | POA: Diagnosis not present

## 2015-03-15 DIAGNOSIS — M810 Age-related osteoporosis without current pathological fracture: Secondary | ICD-10-CM | POA: Diagnosis not present

## 2015-04-05 DIAGNOSIS — R1031 Right lower quadrant pain: Secondary | ICD-10-CM | POA: Diagnosis not present

## 2015-05-28 ENCOUNTER — Emergency Department (INDEPENDENT_AMBULATORY_CARE_PROVIDER_SITE_OTHER)
Admission: EM | Admit: 2015-05-28 | Discharge: 2015-05-28 | Disposition: A | Discharge status: Home / Self Care | Payer: Medicare Other | Source: Home / Self Care | Attending: Emergency Medicine | Admitting: Emergency Medicine

## 2015-05-28 ENCOUNTER — Other Ambulatory Visit (HOSPITAL_COMMUNITY)
Admission: RE | Admit: 2015-05-28 | Discharge: 2015-05-28 | Disposition: A | Discharge status: Home / Self Care | Payer: Medicare Other | Source: Ambulatory Visit | Attending: Emergency Medicine | Admitting: Emergency Medicine

## 2015-05-28 ENCOUNTER — Encounter (HOSPITAL_COMMUNITY): Payer: Self-pay | Admitting: Emergency Medicine

## 2015-05-28 DIAGNOSIS — N760 Acute vaginitis: Secondary | ICD-10-CM | POA: Diagnosis not present

## 2015-05-28 MED ORDER — FLUCONAZOLE 150 MG PO TABS: 150.0000 mg | ORAL_TABLET | Freq: Once | ORAL | Status: AC

## 2015-05-28 MED ORDER — METRONIDAZOLE 500 MG PO TABS: 500.0000 mg | ORAL_TABLET | Freq: Two times a day (BID) | ORAL | Status: AC

## 2015-05-28 NOTE — ED Notes (Signed)
C/o vaginal itching States vagina has a clear discharge Has used vaginal cream on Wednesday  States vagina is burning

## 2015-05-28 NOTE — ED Provider Notes (Signed)
HPI  SUBJECTIVE:  Kellie Ward is a 75 y.o. female who presents with vaginal itching for one week. She reports increased clear nonoderous vaginal discharge that she describes as coming from her labia. She tried that vagicaine topical which made things worse. There are no alleviating factors. No urgency, frequency, dysuria, oderous urine, hematuria,  genital blisters, vaginal itching.. No fevers, N/V, abd pain, back pain. No recent abx use. Patient denies being sexually active in years. STD's not a concern today. No history of BV gonorrhea chlamydia, Trichomonas, yeast infection, syphilis, herpes, HIV, atrophic vaginitis. No h/o DM. Patient status post hysterectomy. Past medical history of diabetes, hypertension, UTIs, cystic kidneys. PMD Dr. Deforest Hoyles.   Past Medical History  Diagnosis Date  . Hypertension   . Thyroid disease   . Liver cyst   . Asthma   . Heart murmur   . Kidney cysts   . Kidney stones   . Hypothyroidism   . Cancer ([place])     stomach  . [place]phea basal cell carcinoma   . Arthritis   . Osteoporosis   . Gout   . Dyslipidemia   . Rhinitis   . Anxiety   . Insomnia   . Chronic back pain   . Depression   . Headache(784.0)     migraines in past  . Vitamin D deficiency     Past Surgical History  Procedure Laterality Date  . Abdominal hysterectomy    . Glaucoma surgery    . Cataract extraction    . Knee surgery    . Tonsillectomy    . Stomach surgery      [facility]  . Tonsillectomy    . Esophagogastroduodenoscopy (egd) with propofol N/A 07/05/2012    Procedure: ESOPHAGOGASTRODUODENOSCOPY (EGD) WITH PROPOFOL;  Surgeon: Garlan Fair, MD;  Location: [facility] ENDOSCOPY;  Service: Endoscopy;  Laterality: N/A;  . Colonoscopy with propofol N/A 07/05/2012    Procedure: COLONOSCOPY WITH PROPOFOL;  Surgeon: Garlan Fair, MD;  Location: [facility] ENDOSCOPY;  Service: Endoscopy;  Laterality: N/A;    Family History  Problem Relation Age of Onset  . Diabetes Mother     Social History   Substance Use Topics  . Smoking status: Former Smoker -- 1.50 packs/day for 22 years    Types: Cigarettes  . Smokeless tobacco: Never Used  . Alcohol Use: No    No current facility-administered medications for this encounter.  Current outpatient prescriptions:  .  albuterol (PROVENTIL HFA;VENTOLIN HFA) 108 (90 BASE) MCG/ACT inhaler, Inhale 1 puff into the lungs every 6 (six) hours as needed. For shortness of breath, Disp: , Rfl:  .  brinzolamide (AZOPT) 1 % ophthalmic suspension, Place 1 drop into the left eye 2 (two) times daily., Disp: , Rfl:  .  cholecalciferol (VITAMIN D) 1000 UNITS tablet, Take 1,000 Units by mouth daily., Disp: , Rfl:  .  clopidogrel (PLAVIX) 75 MG tablet, Take 75 mg by mouth every other day., Disp: , Rfl:  .  Cyanocobalamin (VITAMIN B-12 IJ), Inject 1,000 mcg as directed every 21 ( twenty-one) days., Disp: , Rfl:  .  diltiazem (TIAZAC) 240 MG 24 hr capsule, Take 240 mg by mouth daily., Disp: , Rfl:  .  fluconazole (DIFLUCAN) 150 MG tablet, Take 1 tablet (150 mg total) by mouth once. 1 tab po x 1. May repeat in 72 hours if no improvement, Disp: 2 tablet, Rfl: 0 .  Fluticasone-Salmeterol (ADVAIR) 250-50 MCG/DOSE AEPB, Inhale 1 puff into the lungs every 12 (twelve) hours as needed (for  shortness of breath)., Disp: , Rfl:  .  irbesartan-hydrochlorothiazide (AVALIDE) 150-12.5 MG per tablet, Take 1 tablet by mouth daily., Disp: 30 tablet, Rfl: 3 .  levothyroxine (SYNTHROID, LEVOTHROID) 88 MCG tablet, Take 88 mcg by mouth every morning. , Disp: , Rfl:  .  metroNIDAZOLE (FLAGYL) 500 MG tablet, Take 1 tablet (500 mg total) by mouth 2 (two) times daily. X 7 days, Disp: 14 tablet, Rfl: 0 .  Multiple Vitamin (MULTIVITAMIN) tablet, Take 1 tablet by mouth daily., Disp: , Rfl:  .  ondansetron (ZOFRAN) 4 MG tablet, Take 4 mg by mouth every 8 (eight) hours as needed for nausea. , Disp: , Rfl:  .  simvastatin (ZOCOR) 20 MG tablet, Take 20 mg by mouth every evening., Disp: , Rfl:    Allergies  Allergen Reactions  . Aspirin Hives  . Contrast Media [Iodinated Diagnostic Agents] Anaphylaxis    IODINE  DYE.  Marland Kitchen Honey Hives and Rash  . Menthol (Topical Analgesic) Hives  . Morphine And Related Anaphylaxis  . Iohexol      Code: SOB, Desc: during  a "venogram" of legs in [place] pt became nauseated, couldn't breathe & became unconscious//a.c., Onset Date: [id number]      ROS  As noted in HPI.   Physical Exam  BP 139/71 mmHg  Pulse 76  Temp(Src) 97.7 F (36.5 C) (Oral)  Resp 16  SpO2 98%  Constitutional: Well developed, well nourished, no acute distress Eyes:  EOMI, conjunctiva normal bilaterally HENT: Normocephalic, atraumatic,mucus membranes moist Respiratory: Normal inspiratory effort Cardiovascular: Normal rate GI: nondistended soft, nontender. No suprapubic tenderness  back: No CVA tenderness GU: External genitalia normal.  Normal urethra. Normal vaginal mucosa.  Normal os. Thin  nonoderous  white vaginal discharge. Surgical cuff intact. No adnexal tenderness. No adnexal masses.  Chaperone present during exam skin: No rash, skin intact Musculoskeletal: no deformities Neurologic: Alert & oriented x 3, no focal neuro deficits Psychiatric: Speech and behavior appropriate   ED Course   Medications - No data to display  No orders of the defined types were placed in this encounter.    No results found for this or any previous visit (from the past 24 hour(s)). No results found.  ED Clinical Impression  Vaginitis   ED Assessment/Plan  H&P most c/w yeast infection vs BV vs atrophic vaginitis. No evidence of herpes. Feel that patient is low risk enough, deferring testing for gonorrhea Chlamydia at this time. She has no urinary complaints, no suprapubic, flank or CVA tenderness. Will not check urine. Home with Diflucan given the prominence component of itching, if this does not work she is to do Flagyl 500 mg twice a day for 7 days. If that does  not work, the diagnosis most will be consistent with atrophic vaginitis, she will need to follow-up with her primary care physician or an OB/GYN for this.  Advised pt to refrain from sexual contact until she knows lab results, symptoms resolve.  Discussed  MDM, plan and followup with patient .  Pt provided working phone number. Pt agrees with plan.    *This clinic note was created using Dragon dictation software. Therefore, there may be occasional mistakes despite careful proofreading.  ?    Melynda Ripple, MD 05/28/15 [id number]

## 2015-05-28 NOTE — Discharge Instructions (Signed)
Try the Diflucan first. If it does not work, then try the Flagyl. If this does not work, then the most likely diagnosis is atrophic vaginitis for which you will need to see your primary care physician or an OB/GYN. Give [place] a working phone number so that we can contact you if needed. Refrain from sexual contact until you know your results and your partner(s) are treated if necessary. Return if you get worse, have a fever >100.4, or for any concerns.   Go to www.goodrx.com to look up your medications. This will give you a list of where you can find your prescriptions at the most affordable prices.

## 2015-05-30 LAB — CERVICOVAGINAL ANCILLARY ONLY: Wet Prep (BD Affirm): NEGATIVE

## 2015-10-05 DIAGNOSIS — R05 Cough: Secondary | ICD-10-CM | POA: Diagnosis not present

## 2015-10-28 DIAGNOSIS — S60212A Contusion of left wrist, initial encounter: Secondary | ICD-10-CM | POA: Diagnosis not present

## 2015-10-28 DIAGNOSIS — S20229A Contusion of unspecified back wall of thorax, initial encounter: Secondary | ICD-10-CM | POA: Diagnosis not present

## 2015-10-28 DIAGNOSIS — S40012A Contusion of left shoulder, initial encounter: Secondary | ICD-10-CM | POA: Diagnosis not present

## 2015-10-28 DIAGNOSIS — S40022A Contusion of left upper arm, initial encounter: Secondary | ICD-10-CM | POA: Diagnosis not present

## 2015-11-23 DIAGNOSIS — R21 Rash and other nonspecific skin eruption: Secondary | ICD-10-CM | POA: Diagnosis not present

## 2015-11-23 DIAGNOSIS — Z85020 Personal history of malignant carcinoid tumor of stomach: Secondary | ICD-10-CM | POA: Diagnosis not present

## 2015-11-23 DIAGNOSIS — E1139 Type 2 diabetes mellitus with other diabetic ophthalmic complication: Secondary | ICD-10-CM | POA: Diagnosis not present

## 2015-11-23 DIAGNOSIS — N183 Chronic kidney disease, stage 3 (moderate): Secondary | ICD-10-CM | POA: Diagnosis not present

## 2015-11-23 DIAGNOSIS — H409 Unspecified glaucoma: Secondary | ICD-10-CM | POA: Diagnosis not present

## 2015-11-23 DIAGNOSIS — Z7984 Long term (current) use of oral hypoglycemic drugs: Secondary | ICD-10-CM | POA: Diagnosis not present

## 2015-11-23 DIAGNOSIS — C169 Malignant neoplasm of stomach, unspecified: Secondary | ICD-10-CM | POA: Diagnosis not present

## 2015-11-24 DIAGNOSIS — R51 Headache: Secondary | ICD-10-CM | POA: Diagnosis not present

## 2015-11-24 DIAGNOSIS — E1122 Type 2 diabetes mellitus with diabetic chronic kidney disease: Secondary | ICD-10-CM | POA: Diagnosis not present

## 2015-11-26 DIAGNOSIS — Z6828 Body mass index (BMI) 28.0-28.9, adult: Secondary | ICD-10-CM | POA: Diagnosis not present

## 2015-11-26 DIAGNOSIS — M7981 Nontraumatic hematoma of soft tissue: Secondary | ICD-10-CM | POA: Diagnosis not present

## 2015-12-05 DIAGNOSIS — D510 Vitamin B12 deficiency anemia due to intrinsic factor deficiency: Secondary | ICD-10-CM | POA: Diagnosis not present

## 2015-12-05 DIAGNOSIS — W57XXXA Bitten or stung by nonvenomous insect and other nonvenomous arthropods, initial encounter: Secondary | ICD-10-CM | POA: Diagnosis not present

## 2015-12-05 DIAGNOSIS — R21 Rash and other nonspecific skin eruption: Secondary | ICD-10-CM | POA: Diagnosis not present

## 2015-12-17 DIAGNOSIS — E039 Hypothyroidism, unspecified: Secondary | ICD-10-CM | POA: Diagnosis not present

## 2015-12-17 DIAGNOSIS — Z7984 Long term (current) use of oral hypoglycemic drugs: Secondary | ICD-10-CM | POA: Diagnosis not present

## 2015-12-17 DIAGNOSIS — L940 Localized scleroderma [morphea]: Secondary | ICD-10-CM | POA: Diagnosis not present

## 2015-12-17 DIAGNOSIS — I10 Essential (primary) hypertension: Secondary | ICD-10-CM | POA: Diagnosis not present

## 2015-12-17 DIAGNOSIS — E7800 Pure hypercholesterolemia, unspecified: Secondary | ICD-10-CM | POA: Diagnosis not present

## 2015-12-17 DIAGNOSIS — E538 Deficiency of other specified B group vitamins: Secondary | ICD-10-CM | POA: Diagnosis not present

## 2015-12-17 DIAGNOSIS — Z85020 Personal history of malignant carcinoid tumor of stomach: Secondary | ICD-10-CM | POA: Diagnosis not present

## 2015-12-17 DIAGNOSIS — E119 Type 2 diabetes mellitus without complications: Secondary | ICD-10-CM | POA: Diagnosis not present

## 2016-02-26 DIAGNOSIS — E119 Type 2 diabetes mellitus without complications: Secondary | ICD-10-CM | POA: Diagnosis not present

## 2016-02-26 DIAGNOSIS — I10 Essential (primary) hypertension: Secondary | ICD-10-CM | POA: Diagnosis not present

## 2016-02-26 DIAGNOSIS — K219 Gastro-esophageal reflux disease without esophagitis: Secondary | ICD-10-CM | POA: Diagnosis not present

## 2016-02-26 DIAGNOSIS — D3A00 Benign carcinoid tumor of unspecified site: Secondary | ICD-10-CM | POA: Diagnosis not present

## 2016-02-26 DIAGNOSIS — Z23 Encounter for immunization: Secondary | ICD-10-CM | POA: Diagnosis not present

## 2016-02-26 DIAGNOSIS — E039 Hypothyroidism, unspecified: Secondary | ICD-10-CM | POA: Diagnosis not present

## 2016-02-26 DIAGNOSIS — E538 Deficiency of other specified B group vitamins: Secondary | ICD-10-CM | POA: Diagnosis not present

## 2016-02-26 DIAGNOSIS — N182 Chronic kidney disease, stage 2 (mild): Secondary | ICD-10-CM | POA: Diagnosis not present

## 2016-02-26 DIAGNOSIS — F324 Major depressive disorder, single episode, in partial remission: Secondary | ICD-10-CM | POA: Diagnosis not present

## 2016-02-26 DIAGNOSIS — E7800 Pure hypercholesterolemia, unspecified: Secondary | ICD-10-CM | POA: Diagnosis not present

## 2016-02-26 DIAGNOSIS — F419 Anxiety disorder, unspecified: Secondary | ICD-10-CM | POA: Diagnosis not present

## 2016-02-26 DIAGNOSIS — Z7984 Long term (current) use of oral hypoglycemic drugs: Secondary | ICD-10-CM | POA: Diagnosis not present

## 2016-08-20 DIAGNOSIS — H401134 Primary open-angle glaucoma, bilateral, indeterminate stage: Secondary | ICD-10-CM | POA: Diagnosis not present

## 2016-08-20 DIAGNOSIS — H5201 Hypermetropia, right eye: Secondary | ICD-10-CM | POA: Diagnosis not present

## 2016-08-20 DIAGNOSIS — H52223 Regular astigmatism, bilateral: Secondary | ICD-10-CM | POA: Diagnosis not present

## 2016-08-20 DIAGNOSIS — H524 Presbyopia: Secondary | ICD-10-CM | POA: Diagnosis not present

## 2016-09-10 DIAGNOSIS — H401134 Primary open-angle glaucoma, bilateral, indeterminate stage: Secondary | ICD-10-CM | POA: Diagnosis not present

## 2016-10-02 DIAGNOSIS — E119 Type 2 diabetes mellitus without complications: Secondary | ICD-10-CM | POA: Diagnosis not present

## 2016-10-02 DIAGNOSIS — G8929 Other chronic pain: Secondary | ICD-10-CM | POA: Diagnosis not present

## 2016-10-02 DIAGNOSIS — M47814 Spondylosis without myelopathy or radiculopathy, thoracic region: Secondary | ICD-10-CM | POA: Diagnosis not present

## 2016-10-02 DIAGNOSIS — M19011 Primary osteoarthritis, right shoulder: Secondary | ICD-10-CM | POA: Diagnosis not present

## 2016-10-02 DIAGNOSIS — M259 Joint disorder, unspecified: Secondary | ICD-10-CM | POA: Diagnosis not present

## 2016-10-02 DIAGNOSIS — M546 Pain in thoracic spine: Secondary | ICD-10-CM | POA: Diagnosis not present

## 2016-10-02 DIAGNOSIS — M25511 Pain in right shoulder: Secondary | ICD-10-CM | POA: Diagnosis not present

## 2016-10-02 DIAGNOSIS — D3A00 Benign carcinoid tumor of unspecified site: Secondary | ICD-10-CM | POA: Diagnosis not present

## 2016-10-02 DIAGNOSIS — I10 Essential (primary) hypertension: Secondary | ICD-10-CM | POA: Diagnosis not present

## 2016-10-02 DIAGNOSIS — M47894 Other spondylosis, thoracic region: Secondary | ICD-10-CM | POA: Diagnosis not present

## 2016-10-02 DIAGNOSIS — E039 Hypothyroidism, unspecified: Secondary | ICD-10-CM | POA: Diagnosis not present

## 2016-10-02 DIAGNOSIS — E7800 Pure hypercholesterolemia, unspecified: Secondary | ICD-10-CM | POA: Diagnosis not present

## 2016-10-03 DIAGNOSIS — H401132 Primary open-angle glaucoma, bilateral, moderate stage: Secondary | ICD-10-CM | POA: Diagnosis not present

## 2016-11-04 DIAGNOSIS — M545 Low back pain: Secondary | ICD-10-CM | POA: Diagnosis not present

## 2016-11-04 DIAGNOSIS — J441 Chronic obstructive pulmonary disease with (acute) exacerbation: Secondary | ICD-10-CM | POA: Diagnosis not present

## 2016-11-04 DIAGNOSIS — M25511 Pain in right shoulder: Secondary | ICD-10-CM | POA: Diagnosis not present

## 2016-11-04 DIAGNOSIS — R042 Hemoptysis: Secondary | ICD-10-CM | POA: Diagnosis not present

## 2016-11-04 DIAGNOSIS — E1165 Type 2 diabetes mellitus with hyperglycemia: Secondary | ICD-10-CM | POA: Diagnosis not present

## 2016-11-04 DIAGNOSIS — R05 Cough: Secondary | ICD-10-CM | POA: Diagnosis not present

## 2016-11-04 DIAGNOSIS — R0600 Dyspnea, unspecified: Secondary | ICD-10-CM | POA: Diagnosis not present

## 2016-11-12 DIAGNOSIS — R531 Weakness: Secondary | ICD-10-CM | POA: Diagnosis not present

## 2016-11-12 DIAGNOSIS — R51 Headache: Secondary | ICD-10-CM | POA: Diagnosis not present

## 2016-11-12 DIAGNOSIS — M7541 Impingement syndrome of right shoulder: Secondary | ICD-10-CM | POA: Diagnosis not present

## 2016-11-12 DIAGNOSIS — R05 Cough: Secondary | ICD-10-CM | POA: Diagnosis not present

## 2016-11-12 DIAGNOSIS — M25511 Pain in right shoulder: Secondary | ICD-10-CM | POA: Diagnosis not present

## 2016-11-17 DIAGNOSIS — M7541 Impingement syndrome of right shoulder: Secondary | ICD-10-CM | POA: Diagnosis not present

## 2016-11-17 DIAGNOSIS — R531 Weakness: Secondary | ICD-10-CM | POA: Diagnosis not present

## 2016-11-17 DIAGNOSIS — M25511 Pain in right shoulder: Secondary | ICD-10-CM | POA: Diagnosis not present

## 2016-11-19 DIAGNOSIS — C7A092 Malignant carcinoid tumor of the stomach: Secondary | ICD-10-CM | POA: Diagnosis not present

## 2016-11-21 DIAGNOSIS — M25511 Pain in right shoulder: Secondary | ICD-10-CM | POA: Diagnosis not present

## 2016-11-21 DIAGNOSIS — M7541 Impingement syndrome of right shoulder: Secondary | ICD-10-CM | POA: Diagnosis not present

## 2016-11-21 DIAGNOSIS — R531 Weakness: Secondary | ICD-10-CM | POA: Diagnosis not present

## 2016-11-24 DIAGNOSIS — R911 Solitary pulmonary nodule: Secondary | ICD-10-CM | POA: Diagnosis not present

## 2016-11-24 DIAGNOSIS — K862 Cyst of pancreas: Secondary | ICD-10-CM | POA: Diagnosis not present

## 2016-11-24 DIAGNOSIS — K869 Disease of pancreas, unspecified: Secondary | ICD-10-CM | POA: Diagnosis not present

## 2016-11-24 DIAGNOSIS — C7A092 Malignant carcinoid tumor of the stomach: Secondary | ICD-10-CM | POA: Diagnosis not present

## 2016-11-24 DIAGNOSIS — R590 Localized enlarged lymph nodes: Secondary | ICD-10-CM | POA: Diagnosis not present

## 2016-11-25 DIAGNOSIS — C7A092 Malignant carcinoid tumor of the stomach: Secondary | ICD-10-CM | POA: Diagnosis not present

## 2016-12-14 DIAGNOSIS — N390 Urinary tract infection, site not specified: Secondary | ICD-10-CM | POA: Diagnosis not present

## 2017-01-01 DIAGNOSIS — M25511 Pain in right shoulder: Secondary | ICD-10-CM | POA: Diagnosis not present

## 2017-01-01 DIAGNOSIS — N202 Calculus of kidney with calculus of ureter: Secondary | ICD-10-CM | POA: Diagnosis not present

## 2017-01-01 DIAGNOSIS — E785 Hyperlipidemia, unspecified: Secondary | ICD-10-CM | POA: Diagnosis not present

## 2017-01-01 DIAGNOSIS — E119 Type 2 diabetes mellitus without complications: Secondary | ICD-10-CM | POA: Diagnosis not present

## 2017-01-01 DIAGNOSIS — I10 Essential (primary) hypertension: Secondary | ICD-10-CM | POA: Diagnosis not present

## 2017-01-20 DIAGNOSIS — C44519 Basal cell carcinoma of skin of other part of trunk: Secondary | ICD-10-CM | POA: Diagnosis not present

## 2017-01-20 DIAGNOSIS — L723 Sebaceous cyst: Secondary | ICD-10-CM | POA: Diagnosis not present

## 2017-02-11 DIAGNOSIS — R935 Abnormal findings on diagnostic imaging of other abdominal regions, including retroperitoneum: Secondary | ICD-10-CM | POA: Diagnosis not present

## 2017-02-11 DIAGNOSIS — N200 Calculus of kidney: Secondary | ICD-10-CM | POA: Diagnosis not present

## 2017-06-01 DIAGNOSIS — E119 Type 2 diabetes mellitus without complications: Secondary | ICD-10-CM | POA: Diagnosis not present

## 2017-06-01 DIAGNOSIS — Z6835 Body mass index (BMI) 35.0-35.9, adult: Secondary | ICD-10-CM | POA: Diagnosis not present

## 2017-06-01 DIAGNOSIS — N3592 Unspecified urethral stricture, female: Secondary | ICD-10-CM | POA: Diagnosis not present

## 2017-06-03 DIAGNOSIS — Z1211 Encounter for screening for malignant neoplasm of colon: Secondary | ICD-10-CM | POA: Diagnosis not present

## 2017-06-03 DIAGNOSIS — R809 Proteinuria, unspecified: Secondary | ICD-10-CM | POA: Diagnosis not present

## 2017-06-05 DIAGNOSIS — N3592 Unspecified urethral stricture, female: Secondary | ICD-10-CM | POA: Diagnosis not present

## 2017-06-05 DIAGNOSIS — Z6835 Body mass index (BMI) 35.0-35.9, adult: Secondary | ICD-10-CM | POA: Diagnosis not present

## 2017-06-05 DIAGNOSIS — N281 Cyst of kidney, acquired: Secondary | ICD-10-CM | POA: Diagnosis not present

## 2017-06-11 DIAGNOSIS — Z6835 Body mass index (BMI) 35.0-35.9, adult: Secondary | ICD-10-CM | POA: Diagnosis not present

## 2017-06-11 DIAGNOSIS — N200 Calculus of kidney: Secondary | ICD-10-CM | POA: Diagnosis not present

## 2017-06-11 DIAGNOSIS — N3592 Unspecified urethral stricture, female: Secondary | ICD-10-CM | POA: Diagnosis not present

## 2017-06-11 DIAGNOSIS — N390 Urinary tract infection, site not specified: Secondary | ICD-10-CM | POA: Diagnosis not present

## 2017-07-01 DIAGNOSIS — K5730 Diverticulosis of large intestine without perforation or abscess without bleeding: Secondary | ICD-10-CM | POA: Diagnosis not present

## 2017-07-01 DIAGNOSIS — N281 Cyst of kidney, acquired: Secondary | ICD-10-CM | POA: Diagnosis not present

## 2017-07-01 DIAGNOSIS — N200 Calculus of kidney: Secondary | ICD-10-CM | POA: Diagnosis not present

## 2017-07-09 DIAGNOSIS — H40003 Preglaucoma, unspecified, bilateral: Secondary | ICD-10-CM | POA: Diagnosis not present

## 2017-07-13 DIAGNOSIS — N200 Calculus of kidney: Secondary | ICD-10-CM | POA: Diagnosis not present

## 2017-07-13 DIAGNOSIS — N281 Cyst of kidney, acquired: Secondary | ICD-10-CM | POA: Diagnosis not present

## 2017-07-20 DIAGNOSIS — S0592XA Unspecified injury of left eye and orbit, initial encounter: Secondary | ICD-10-CM | POA: Diagnosis not present

## 2017-07-20 DIAGNOSIS — S0512XA Contusion of eyeball and orbital tissues, left eye, initial encounter: Secondary | ICD-10-CM | POA: Diagnosis not present

## 2017-07-29 DIAGNOSIS — H2000 Unspecified acute and subacute iridocyclitis: Secondary | ICD-10-CM | POA: Diagnosis not present

## 2017-07-29 DIAGNOSIS — H20012 Primary iridocyclitis, left eye: Secondary | ICD-10-CM | POA: Diagnosis not present

## 2017-07-30 DIAGNOSIS — E785 Hyperlipidemia, unspecified: Secondary | ICD-10-CM | POA: Diagnosis not present

## 2017-07-30 DIAGNOSIS — E1169 Type 2 diabetes mellitus with other specified complication: Secondary | ICD-10-CM | POA: Diagnosis not present

## 2017-07-30 DIAGNOSIS — N393 Stress incontinence (female) (male): Secondary | ICD-10-CM | POA: Diagnosis not present

## 2017-07-30 DIAGNOSIS — I10 Essential (primary) hypertension: Secondary | ICD-10-CM | POA: Diagnosis not present

## 2017-07-30 DIAGNOSIS — E039 Hypothyroidism, unspecified: Secondary | ICD-10-CM | POA: Diagnosis not present

## 2017-07-30 DIAGNOSIS — R3915 Urgency of urination: Secondary | ICD-10-CM | POA: Diagnosis not present

## 2017-08-05 DIAGNOSIS — E559 Vitamin D deficiency, unspecified: Secondary | ICD-10-CM | POA: Diagnosis not present

## 2017-08-05 DIAGNOSIS — E039 Hypothyroidism, unspecified: Secondary | ICD-10-CM | POA: Diagnosis not present

## 2017-08-05 DIAGNOSIS — E1165 Type 2 diabetes mellitus with hyperglycemia: Secondary | ICD-10-CM | POA: Diagnosis not present

## 2017-08-05 DIAGNOSIS — E782 Mixed hyperlipidemia: Secondary | ICD-10-CM | POA: Diagnosis not present

## 2017-08-07 DIAGNOSIS — H401133 Primary open-angle glaucoma, bilateral, severe stage: Secondary | ICD-10-CM | POA: Diagnosis not present

## 2017-08-10 DIAGNOSIS — R3915 Urgency of urination: Secondary | ICD-10-CM | POA: Diagnosis not present

## 2017-08-10 DIAGNOSIS — N393 Stress incontinence (female) (male): Secondary | ICD-10-CM | POA: Diagnosis not present

## 2017-08-13 DIAGNOSIS — H401123 Primary open-angle glaucoma, left eye, severe stage: Secondary | ICD-10-CM | POA: Diagnosis not present

## 2017-08-20 DIAGNOSIS — R1111 Vomiting without nausea: Secondary | ICD-10-CM | POA: Diagnosis not present

## 2017-08-20 DIAGNOSIS — K2900 Acute gastritis without bleeding: Secondary | ICD-10-CM | POA: Diagnosis not present

## 2017-08-20 DIAGNOSIS — R1010 Upper abdominal pain, unspecified: Secondary | ICD-10-CM | POA: Diagnosis not present

## 2017-08-24 DIAGNOSIS — C169 Malignant neoplasm of stomach, unspecified: Secondary | ICD-10-CM | POA: Diagnosis not present

## 2017-08-24 DIAGNOSIS — R195 Other fecal abnormalities: Secondary | ICD-10-CM | POA: Diagnosis not present

## 2017-08-24 DIAGNOSIS — R1013 Epigastric pain: Secondary | ICD-10-CM | POA: Diagnosis not present

## 2017-08-24 DIAGNOSIS — K921 Melena: Secondary | ICD-10-CM | POA: Diagnosis not present

## 2017-08-25 DIAGNOSIS — R195 Other fecal abnormalities: Secondary | ICD-10-CM | POA: Diagnosis not present

## 2017-08-25 DIAGNOSIS — R194 Change in bowel habit: Secondary | ICD-10-CM | POA: Diagnosis not present

## 2017-08-25 DIAGNOSIS — C169 Malignant neoplasm of stomach, unspecified: Secondary | ICD-10-CM | POA: Diagnosis not present

## 2017-08-27 DIAGNOSIS — K760 Fatty (change of) liver, not elsewhere classified: Secondary | ICD-10-CM | POA: Diagnosis not present

## 2017-08-27 DIAGNOSIS — R945 Abnormal results of liver function studies: Secondary | ICD-10-CM | POA: Diagnosis not present

## 2017-08-28 DIAGNOSIS — R1084 Generalized abdominal pain: Secondary | ICD-10-CM | POA: Diagnosis not present

## 2017-08-31 DIAGNOSIS — R1084 Generalized abdominal pain: Secondary | ICD-10-CM | POA: Diagnosis not present

## 2017-09-01 DIAGNOSIS — R1084 Generalized abdominal pain: Secondary | ICD-10-CM | POA: Diagnosis not present

## 2017-09-01 DIAGNOSIS — E785 Hyperlipidemia, unspecified: Secondary | ICD-10-CM | POA: Diagnosis not present

## 2017-09-01 DIAGNOSIS — I10 Essential (primary) hypertension: Secondary | ICD-10-CM | POA: Diagnosis not present

## 2017-09-01 DIAGNOSIS — E1169 Type 2 diabetes mellitus with other specified complication: Secondary | ICD-10-CM | POA: Diagnosis not present

## 2017-09-01 DIAGNOSIS — K5730 Diverticulosis of large intestine without perforation or abscess without bleeding: Secondary | ICD-10-CM | POA: Diagnosis not present

## 2017-09-01 DIAGNOSIS — E039 Hypothyroidism, unspecified: Secondary | ICD-10-CM | POA: Diagnosis not present

## 2017-09-02 DIAGNOSIS — J111 Influenza due to unidentified influenza virus with other respiratory manifestations: Secondary | ICD-10-CM | POA: Diagnosis not present

## 2017-09-02 DIAGNOSIS — E1165 Type 2 diabetes mellitus with hyperglycemia: Secondary | ICD-10-CM | POA: Diagnosis not present

## 2017-09-02 DIAGNOSIS — J209 Acute bronchitis, unspecified: Secondary | ICD-10-CM | POA: Diagnosis not present

## 2017-09-02 DIAGNOSIS — R05 Cough: Secondary | ICD-10-CM | POA: Diagnosis not present

## 2017-09-16 DIAGNOSIS — L820 Inflamed seborrheic keratosis: Secondary | ICD-10-CM | POA: Diagnosis not present

## 2017-09-16 DIAGNOSIS — L708 Other acne: Secondary | ICD-10-CM | POA: Diagnosis not present

## 2017-09-16 DIAGNOSIS — L853 Xerosis cutis: Secondary | ICD-10-CM | POA: Diagnosis not present

## 2017-09-16 DIAGNOSIS — L570 Actinic keratosis: Secondary | ICD-10-CM | POA: Diagnosis not present

## 2017-09-16 DIAGNOSIS — L821 Other seborrheic keratosis: Secondary | ICD-10-CM | POA: Diagnosis not present

## 2017-10-20 DIAGNOSIS — H401133 Primary open-angle glaucoma, bilateral, severe stage: Secondary | ICD-10-CM | POA: Diagnosis not present

## 2017-10-22 DIAGNOSIS — M545 Low back pain: Secondary | ICD-10-CM | POA: Diagnosis not present

## 2017-11-02 DIAGNOSIS — R1084 Generalized abdominal pain: Secondary | ICD-10-CM | POA: Diagnosis not present

## 2017-11-12 DIAGNOSIS — R0602 Shortness of breath: Secondary | ICD-10-CM | POA: Diagnosis not present

## 2017-11-17 DIAGNOSIS — R918 Other nonspecific abnormal finding of lung field: Secondary | ICD-10-CM | POA: Diagnosis not present

## 2017-11-17 DIAGNOSIS — E038 Other specified hypothyroidism: Secondary | ICD-10-CM | POA: Diagnosis not present

## 2017-11-17 DIAGNOSIS — J4530 Mild persistent asthma, uncomplicated: Secondary | ICD-10-CM | POA: Diagnosis not present

## 2017-11-17 DIAGNOSIS — E1165 Type 2 diabetes mellitus with hyperglycemia: Secondary | ICD-10-CM | POA: Diagnosis not present

## 2017-11-18 DIAGNOSIS — N8111 Cystocele, midline: Secondary | ICD-10-CM | POA: Diagnosis not present

## 2017-11-18 DIAGNOSIS — N393 Stress incontinence (female) (male): Secondary | ICD-10-CM | POA: Diagnosis not present

## 2017-11-24 DIAGNOSIS — E039 Hypothyroidism, unspecified: Secondary | ICD-10-CM | POA: Diagnosis not present

## 2017-11-24 DIAGNOSIS — E1169 Type 2 diabetes mellitus with other specified complication: Secondary | ICD-10-CM | POA: Diagnosis not present

## 2017-11-24 DIAGNOSIS — E785 Hyperlipidemia, unspecified: Secondary | ICD-10-CM | POA: Diagnosis not present

## 2017-11-27 DIAGNOSIS — E039 Hypothyroidism, unspecified: Secondary | ICD-10-CM | POA: Diagnosis not present

## 2017-11-27 DIAGNOSIS — E1169 Type 2 diabetes mellitus with other specified complication: Secondary | ICD-10-CM | POA: Diagnosis not present

## 2017-11-27 DIAGNOSIS — I2510 Atherosclerotic heart disease of native coronary artery without angina pectoris: Secondary | ICD-10-CM | POA: Diagnosis not present

## 2017-11-27 DIAGNOSIS — I119 Hypertensive heart disease without heart failure: Secondary | ICD-10-CM | POA: Diagnosis not present

## 2017-11-27 DIAGNOSIS — I700 Atherosclerosis of aorta: Secondary | ICD-10-CM | POA: Diagnosis not present

## 2017-11-27 DIAGNOSIS — E785 Hyperlipidemia, unspecified: Secondary | ICD-10-CM | POA: Diagnosis not present

## 2017-11-27 DIAGNOSIS — E7800 Pure hypercholesterolemia, unspecified: Secondary | ICD-10-CM | POA: Diagnosis not present

## 2017-11-30 DIAGNOSIS — N810 Urethrocele: Secondary | ICD-10-CM | POA: Diagnosis not present

## 2017-12-08 DIAGNOSIS — I209 Angina pectoris, unspecified: Secondary | ICD-10-CM | POA: Diagnosis not present

## 2017-12-14 DIAGNOSIS — C7A00 Malignant carcinoid tumor of unspecified site: Secondary | ICD-10-CM | POA: Diagnosis not present

## 2017-12-14 DIAGNOSIS — R0781 Pleurodynia: Secondary | ICD-10-CM | POA: Diagnosis not present

## 2017-12-14 DIAGNOSIS — R918 Other nonspecific abnormal finding of lung field: Secondary | ICD-10-CM | POA: Diagnosis not present

## 2017-12-14 DIAGNOSIS — J4530 Mild persistent asthma, uncomplicated: Secondary | ICD-10-CM | POA: Diagnosis not present

## 2017-12-21 DIAGNOSIS — E1122 Type 2 diabetes mellitus with diabetic chronic kidney disease: Secondary | ICD-10-CM | POA: Diagnosis not present

## 2017-12-22 DIAGNOSIS — I2510 Atherosclerotic heart disease of native coronary artery without angina pectoris: Secondary | ICD-10-CM | POA: Diagnosis not present

## 2017-12-22 DIAGNOSIS — N810 Urethrocele: Secondary | ICD-10-CM | POA: Diagnosis not present

## 2017-12-22 DIAGNOSIS — I700 Atherosclerosis of aorta: Secondary | ICD-10-CM | POA: Diagnosis not present

## 2017-12-22 DIAGNOSIS — I119 Hypertensive heart disease without heart failure: Secondary | ICD-10-CM | POA: Diagnosis not present

## 2017-12-22 DIAGNOSIS — N393 Stress incontinence (female) (male): Secondary | ICD-10-CM | POA: Diagnosis not present

## 2017-12-22 DIAGNOSIS — N8110 Cystocele, unspecified: Secondary | ICD-10-CM | POA: Diagnosis not present

## 2017-12-22 DIAGNOSIS — E7800 Pure hypercholesterolemia, unspecified: Secondary | ICD-10-CM | POA: Diagnosis not present

## 2017-12-24 DIAGNOSIS — Z01812 Encounter for preprocedural laboratory examination: Secondary | ICD-10-CM | POA: Diagnosis not present

## 2017-12-24 DIAGNOSIS — I208 Other forms of angina pectoris: Secondary | ICD-10-CM | POA: Diagnosis not present

## 2017-12-24 DIAGNOSIS — R911 Solitary pulmonary nodule: Secondary | ICD-10-CM | POA: Diagnosis not present

## 2017-12-24 DIAGNOSIS — Z01811 Encounter for preprocedural respiratory examination: Secondary | ICD-10-CM | POA: Diagnosis not present

## 2017-12-25 DIAGNOSIS — Z7984 Long term (current) use of oral hypoglycemic drugs: Secondary | ICD-10-CM | POA: Diagnosis not present

## 2017-12-25 DIAGNOSIS — E119 Type 2 diabetes mellitus without complications: Secondary | ICD-10-CM | POA: Diagnosis not present

## 2017-12-25 DIAGNOSIS — I209 Angina pectoris, unspecified: Secondary | ICD-10-CM | POA: Diagnosis not present

## 2017-12-25 DIAGNOSIS — I208 Other forms of angina pectoris: Secondary | ICD-10-CM | POA: Diagnosis not present

## 2017-12-28 DIAGNOSIS — I700 Atherosclerosis of aorta: Secondary | ICD-10-CM | POA: Diagnosis not present

## 2017-12-28 DIAGNOSIS — I2510 Atherosclerotic heart disease of native coronary artery without angina pectoris: Secondary | ICD-10-CM | POA: Diagnosis not present

## 2017-12-28 DIAGNOSIS — I119 Hypertensive heart disease without heart failure: Secondary | ICD-10-CM | POA: Diagnosis not present

## 2017-12-28 DIAGNOSIS — E7800 Pure hypercholesterolemia, unspecified: Secondary | ICD-10-CM | POA: Diagnosis not present

## 2018-01-01 DIAGNOSIS — N810 Urethrocele: Secondary | ICD-10-CM | POA: Diagnosis not present

## 2018-01-05 DIAGNOSIS — G301 Alzheimer's disease with late onset: Secondary | ICD-10-CM | POA: Diagnosis not present

## 2018-01-05 DIAGNOSIS — G43119 Migraine with aura, intractable, without status migrainosus: Secondary | ICD-10-CM | POA: Diagnosis not present

## 2018-01-05 DIAGNOSIS — F330 Major depressive disorder, recurrent, mild: Secondary | ICD-10-CM | POA: Diagnosis not present

## 2018-01-13 DIAGNOSIS — N810 Urethrocele: Secondary | ICD-10-CM | POA: Diagnosis not present

## 2018-01-15 DIAGNOSIS — G301 Alzheimer's disease with late onset: Secondary | ICD-10-CM | POA: Diagnosis not present

## 2018-01-19 DIAGNOSIS — H401133 Primary open-angle glaucoma, bilateral, severe stage: Secondary | ICD-10-CM | POA: Diagnosis not present

## 2018-01-19 DIAGNOSIS — H401123 Primary open-angle glaucoma, left eye, severe stage: Secondary | ICD-10-CM | POA: Diagnosis not present

## 2018-01-19 DIAGNOSIS — H401113 Primary open-angle glaucoma, right eye, severe stage: Secondary | ICD-10-CM | POA: Diagnosis not present

## 2018-01-21 DIAGNOSIS — N8111 Cystocele, midline: Secondary | ICD-10-CM | POA: Diagnosis present

## 2018-01-21 DIAGNOSIS — N393 Stress incontinence (female) (male): Secondary | ICD-10-CM | POA: Diagnosis not present

## 2018-01-21 DIAGNOSIS — I10 Essential (primary) hypertension: Secondary | ICD-10-CM | POA: Diagnosis present

## 2018-01-21 DIAGNOSIS — E039 Hypothyroidism, unspecified: Secondary | ICD-10-CM | POA: Diagnosis present

## 2018-01-21 DIAGNOSIS — N8110 Cystocele, unspecified: Secondary | ICD-10-CM | POA: Diagnosis not present

## 2018-01-21 DIAGNOSIS — N813 Complete uterovaginal prolapse: Secondary | ICD-10-CM | POA: Diagnosis not present

## 2018-01-21 DIAGNOSIS — Z7984 Long term (current) use of oral hypoglycemic drugs: Secondary | ICD-10-CM | POA: Diagnosis not present

## 2018-01-21 DIAGNOSIS — E119 Type 2 diabetes mellitus without complications: Secondary | ICD-10-CM | POA: Diagnosis present

## 2018-01-29 DIAGNOSIS — N393 Stress incontinence (female) (male): Secondary | ICD-10-CM | POA: Diagnosis not present

## 2018-01-29 DIAGNOSIS — N813 Complete uterovaginal prolapse: Secondary | ICD-10-CM | POA: Diagnosis not present

## 2018-02-09 DIAGNOSIS — K2940 Chronic atrophic gastritis without bleeding: Secondary | ICD-10-CM | POA: Diagnosis not present

## 2018-02-22 DIAGNOSIS — E782 Mixed hyperlipidemia: Secondary | ICD-10-CM | POA: Diagnosis not present

## 2018-02-22 DIAGNOSIS — E039 Hypothyroidism, unspecified: Secondary | ICD-10-CM | POA: Diagnosis not present

## 2018-02-22 DIAGNOSIS — E1165 Type 2 diabetes mellitus with hyperglycemia: Secondary | ICD-10-CM | POA: Diagnosis not present

## 2018-02-24 DIAGNOSIS — E039 Hypothyroidism, unspecified: Secondary | ICD-10-CM | POA: Diagnosis not present

## 2018-02-24 DIAGNOSIS — I10 Essential (primary) hypertension: Secondary | ICD-10-CM | POA: Diagnosis not present

## 2018-02-24 DIAGNOSIS — E559 Vitamin D deficiency, unspecified: Secondary | ICD-10-CM | POA: Diagnosis not present

## 2018-02-24 DIAGNOSIS — E119 Type 2 diabetes mellitus without complications: Secondary | ICD-10-CM | POA: Diagnosis not present

## 2018-02-24 DIAGNOSIS — E785 Hyperlipidemia, unspecified: Secondary | ICD-10-CM | POA: Diagnosis not present

## 2018-02-26 DIAGNOSIS — G43119 Migraine with aura, intractable, without status migrainosus: Secondary | ICD-10-CM | POA: Diagnosis not present

## 2018-02-26 DIAGNOSIS — F330 Major depressive disorder, recurrent, mild: Secondary | ICD-10-CM | POA: Diagnosis not present

## 2018-02-26 DIAGNOSIS — G301 Alzheimer's disease with late onset: Secondary | ICD-10-CM | POA: Diagnosis not present

## 2018-02-27 DIAGNOSIS — A64 Unspecified sexually transmitted disease: Secondary | ICD-10-CM | POA: Diagnosis not present

## 2018-02-27 DIAGNOSIS — E7220 Disorder of urea cycle metabolism, unspecified: Secondary | ICD-10-CM | POA: Diagnosis not present

## 2018-02-27 DIAGNOSIS — E033 Postinfectious hypothyroidism: Secondary | ICD-10-CM | POA: Diagnosis not present

## 2018-02-27 DIAGNOSIS — E538 Deficiency of other specified B group vitamins: Secondary | ICD-10-CM | POA: Diagnosis not present

## 2018-02-27 DIAGNOSIS — G301 Alzheimer's disease with late onset: Secondary | ICD-10-CM | POA: Diagnosis not present

## 2018-03-02 DIAGNOSIS — G301 Alzheimer's disease with late onset: Secondary | ICD-10-CM | POA: Diagnosis not present

## 2018-03-02 DIAGNOSIS — F411 Generalized anxiety disorder: Secondary | ICD-10-CM | POA: Diagnosis not present

## 2018-03-19 DIAGNOSIS — E785 Hyperlipidemia, unspecified: Secondary | ICD-10-CM | POA: Diagnosis not present

## 2018-03-19 DIAGNOSIS — E559 Vitamin D deficiency, unspecified: Secondary | ICD-10-CM | POA: Diagnosis not present

## 2018-03-19 DIAGNOSIS — E039 Hypothyroidism, unspecified: Secondary | ICD-10-CM | POA: Diagnosis not present

## 2018-03-19 DIAGNOSIS — I10 Essential (primary) hypertension: Secondary | ICD-10-CM | POA: Diagnosis not present

## 2018-03-19 DIAGNOSIS — E119 Type 2 diabetes mellitus without complications: Secondary | ICD-10-CM | POA: Diagnosis not present

## 2018-03-23 DIAGNOSIS — N3000 Acute cystitis without hematuria: Secondary | ICD-10-CM | POA: Diagnosis not present

## 2018-04-15 DIAGNOSIS — I739 Peripheral vascular disease, unspecified: Secondary | ICD-10-CM | POA: Diagnosis not present

## 2018-04-15 DIAGNOSIS — M79602 Pain in left arm: Secondary | ICD-10-CM | POA: Diagnosis not present

## 2018-04-15 DIAGNOSIS — N390 Urinary tract infection, site not specified: Secondary | ICD-10-CM | POA: Diagnosis not present

## 2018-04-15 DIAGNOSIS — R911 Solitary pulmonary nodule: Secondary | ICD-10-CM | POA: Diagnosis not present

## 2018-04-15 DIAGNOSIS — Z7901 Long term (current) use of anticoagulants: Secondary | ICD-10-CM | POA: Diagnosis not present

## 2018-04-15 DIAGNOSIS — I209 Angina pectoris, unspecified: Secondary | ICD-10-CM | POA: Diagnosis not present

## 2018-04-15 DIAGNOSIS — I517 Cardiomegaly: Secondary | ICD-10-CM | POA: Diagnosis not present

## 2018-04-15 DIAGNOSIS — J111 Influenza due to unidentified influenza virus with other respiratory manifestations: Secondary | ICD-10-CM | POA: Diagnosis not present

## 2018-05-01 DIAGNOSIS — D3A092 Benign carcinoid tumor of the stomach: Secondary | ICD-10-CM | POA: Diagnosis not present

## 2018-05-03 DIAGNOSIS — K862 Cyst of pancreas: Secondary | ICD-10-CM | POA: Diagnosis not present

## 2018-05-03 DIAGNOSIS — D3A092 Benign carcinoid tumor of the stomach: Secondary | ICD-10-CM | POA: Diagnosis not present

## 2018-05-03 DIAGNOSIS — K5730 Diverticulosis of large intestine without perforation or abscess without bleeding: Secondary | ICD-10-CM | POA: Diagnosis not present

## 2018-05-03 DIAGNOSIS — K30 Functional dyspepsia: Secondary | ICD-10-CM | POA: Diagnosis not present
